# Patient Record
Sex: Male | Born: 1980 | Race: Black or African American | Hispanic: No | Marital: Married | Smoking: Never smoker
Health system: Southern US, Community
[De-identification: ages and names within clinical notes are randomized; demographics above are authoritative.]

## PROBLEM LIST (undated history)

## (undated) DIAGNOSIS — E559 Vitamin D deficiency, unspecified: Secondary | ICD-10-CM

## (undated) DIAGNOSIS — F32A Depression, unspecified: Secondary | ICD-10-CM

## (undated) DIAGNOSIS — K649 Unspecified hemorrhoids: Secondary | ICD-10-CM

## (undated) DIAGNOSIS — E669 Obesity, unspecified: Secondary | ICD-10-CM

## (undated) DIAGNOSIS — R7303 Prediabetes: Secondary | ICD-10-CM

## (undated) DIAGNOSIS — E785 Hyperlipidemia, unspecified: Secondary | ICD-10-CM

## (undated) DIAGNOSIS — I10 Essential (primary) hypertension: Secondary | ICD-10-CM

## (undated) DIAGNOSIS — F329 Major depressive disorder, single episode, unspecified: Secondary | ICD-10-CM

## (undated) HISTORY — DX: Obesity, unspecified: E66.9

## (undated) HISTORY — DX: Depression, unspecified: F32.A

## (undated) HISTORY — DX: Vitamin D deficiency, unspecified: E55.9

## (undated) HISTORY — DX: Prediabetes: R73.03

## (undated) HISTORY — DX: Essential (primary) hypertension: I10

## (undated) HISTORY — DX: Major depressive disorder, single episode, unspecified: F32.9

## (undated) HISTORY — PX: WISDOM TOOTH EXTRACTION: SHX21

## (undated) HISTORY — DX: Hyperlipidemia, unspecified: E78.5

## (undated) HISTORY — DX: Unspecified hemorrhoids: K64.9

---

## 1999-04-19 ENCOUNTER — Encounter: Payer: Self-pay | Admitting: Emergency Medicine

## 1999-04-19 ENCOUNTER — Emergency Department (HOSPITAL_COMMUNITY): Admission: EM | Admit: 1999-04-19 | Discharge: 1999-04-19 | Payer: Self-pay | Admitting: Emergency Medicine

## 2001-10-06 ENCOUNTER — Emergency Department (HOSPITAL_COMMUNITY): Admission: EM | Admit: 2001-10-06 | Discharge: 2001-10-06 | Payer: Self-pay | Admitting: Emergency Medicine

## 2010-05-28 ENCOUNTER — Inpatient Hospital Stay (INDEPENDENT_AMBULATORY_CARE_PROVIDER_SITE_OTHER)
Admission: RE | Admit: 2010-05-28 | Discharge: 2010-05-28 | Disposition: A | Discharge status: Home / Self Care | Payer: 59 | Source: Ambulatory Visit | Attending: Family Medicine | Admitting: Family Medicine

## 2010-05-28 DIAGNOSIS — J029 Acute pharyngitis, unspecified: Secondary | ICD-10-CM

## 2010-05-28 DIAGNOSIS — R599 Enlarged lymph nodes, unspecified: Secondary | ICD-10-CM

## 2011-05-11 ENCOUNTER — Other Ambulatory Visit (HOSPITAL_COMMUNITY): Payer: Self-pay | Admitting: Internal Medicine

## 2011-05-11 ENCOUNTER — Ambulatory Visit (HOSPITAL_COMMUNITY)
Admission: RE | Admit: 2011-05-11 | Discharge: 2011-05-11 | Disposition: A | Discharge status: Home / Self Care | Payer: 59 | Source: Ambulatory Visit | Attending: Internal Medicine | Admitting: Internal Medicine

## 2011-05-11 DIAGNOSIS — M545 Low back pain, unspecified: Secondary | ICD-10-CM | POA: Insufficient documentation

## 2011-05-11 DIAGNOSIS — IMO0002 Reserved for concepts with insufficient information to code with codable children: Secondary | ICD-10-CM | POA: Insufficient documentation

## 2011-05-16 ENCOUNTER — Encounter: Payer: Self-pay | Admitting: Gastroenterology

## 2011-05-20 ENCOUNTER — Ambulatory Visit: Payer: 59 | Admitting: Gastroenterology

## 2011-06-01 ENCOUNTER — Ambulatory Visit: Payer: 59 | Admitting: Gastroenterology

## 2011-06-02 ENCOUNTER — Telehealth: Payer: Self-pay | Admitting: Gastroenterology

## 2011-06-02 NOTE — Telephone Encounter (Signed)
Message copied by Arna Snipe on Thu Jun 02, 2011  8:17 AM ------      Message from: Donata Duff      Created: Wed Jun 01, 2011  9:23 AM       Do not bill

## 2011-07-13 ENCOUNTER — Emergency Department (INDEPENDENT_AMBULATORY_CARE_PROVIDER_SITE_OTHER)
Admission: EM | Admit: 2011-07-13 | Discharge: 2011-07-13 | Disposition: A | Discharge status: Home / Self Care | Payer: 59 | Source: Home / Self Care

## 2011-07-13 ENCOUNTER — Encounter (HOSPITAL_COMMUNITY): Payer: Self-pay | Admitting: *Deleted

## 2011-07-13 DIAGNOSIS — S61409A Unspecified open wound of unspecified hand, initial encounter: Secondary | ICD-10-CM

## 2011-07-13 DIAGNOSIS — S61412A Laceration without foreign body of left hand, initial encounter: Secondary | ICD-10-CM

## 2011-07-13 MED ORDER — BACITRACIN 500 UNIT/GM EX OINT
1.0000 "application " | TOPICAL_OINTMENT | Freq: Once | CUTANEOUS | Status: AC
  Administered 2011-07-13: 1 via TOPICAL

## 2011-07-13 NOTE — ED Provider Notes (Signed)
History     CSN: [id number]  Arrival date & time 07/13/11  1946   None     Chief Complaint  Patient presents with  . Extremity Laceration    (Consider location/radiation/quality/duration/timing/severity/associated sxs/prior treatment) HPI Comments: Patient presents this evening with a left hand laceration. He states that this occurred just prior to arrival. He has been removing some old flooring from the home and as he was removing one of the garbage bags from the house sustained a laceration to the palm of his left hand. He is assuming there is a nail in the garbage bag. His last tetanus vaccine was February of this year.   Past Medical History  Diagnosis Date  . Hypertension   . Depression   . Hyperlipidemia   . Pre-diabetes     History reviewed. No pertinent past surgical history.  Family History  Problem Relation Age of Onset  . Hypertension Father   . Heart disease Mother   . Diabetes Father   . Depression Mother   . Thyroid disease Mother   . Hyperlipidemia Brother     History  Substance Use Topics  . Smoking status: Never Smoker   . Smokeless tobacco: Not on file  . Alcohol Use: No      Review of Systems  Musculoskeletal: Negative for joint swelling.  Skin: Positive for wound.  Neurological: Negative for weakness and numbness.    Allergies  Statins  Home Medications   Current Outpatient Rx  Name Route Sig Dispense Refill  . VITAMIN D 2000 UNITS PO CAPS Oral Take by mouth as directed.    [name] CITALOPRAM HYDROBROMIDE 20 MG PO TABS Oral Take 20 mg by mouth daily.    . COLESEVELAM HCL 3.75 G PO PACK Oral Take by mouth as directed.    [name] EZETIMIBE 10 MG PO TABS Oral Take 10 mg by mouth daily.    [name] LISINOPRIL-HYDROCHLOROTHIAZIDE 20-12.5 MG PO TABS Oral Take 1 tablet by mouth daily.      BP 158/85  Pulse 94  Temp(Src) 97.1 F (36.2 C) (Oral)  Resp 18  SpO2 98%  Physical Exam  Nursing note and vitals reviewed. Constitutional: He appears  well-developed and well-nourished. No distress.  HENT:  Head: Normocephalic and atraumatic.  Musculoskeletal:       Left hand: He exhibits laceration. He exhibits normal range of motion, no bony tenderness, normal capillary refill, no deformity and no swelling. normal sensation noted. Normal strength noted.       Hands: Neurological: He is alert.  Skin: Skin is warm and dry.  Psychiatric: He has a normal mood and affect.    ED Course  LACERATION REPAIR Date/Time: 07/13/2011 9:12 PM Performed by: Melody Comas Authorized by: Melody Comas Consent: Verbal consent obtained. Written consent not obtained. Consent given by: patient Patient understanding: patient states understanding of the procedure being performed Patient consent: the patient's understanding of the procedure matches consent given Body area: upper extremity Location details: left hand Laceration length: 1.5 cm Tendon involvement: none Nerve involvement: none Vascular damage: no Preparation: Patient was prepped and draped in the usual sterile fashion. Amount of cleaning: extensive Debridement: none Degree of undermining: none Skin closure: 5-0 nylon Number of sutures: 3 Technique: simple Approximation: close Approximation difficulty: simple Dressing: antibiotic ointment and 4x4 sterile gauze Patient tolerance: Patient tolerated the procedure well with no immediate complications. Comments: Pt requested no local anesthetic for suturing.    (including critical care time)  Labs Reviewed - No  data to display No results found.   1. Laceration of left hand       MDM          Melody Comas, [place] 07/13/11 2120

## 2011-07-13 NOTE — ED Notes (Signed)
Pt with laceration left palm of hand approx 1cm long - bleeding controlled with dressing - pt had tetnus x 2 months ago

## 2011-07-13 NOTE — Discharge Instructions (Signed)
Tylenol or Ibuprofen as needed for discomfort. Wash the wound twice daily with mild soap and water. Then apply an antibiotic ointment and a clean dressing. Watch for signs of infection, such as redness, swelling or drainage. If this develops return as soon as possible for recheck. Suture removal in 7 days. Laceration Care, Adult A laceration is a cut that goes through all layers of the skin. The cut goes into the tissue beneath the skin. HOME CARE For stitches (sutures) or staples:  Keep the cut clean and dry.   If you have a bandage (dressing), change it at least once a day. Change the bandage if it gets wet or dirty, or as told by your doctor.   Wash the cut with soap and water 2 times a day. Rinse the cut with water. Pat it dry with a clean towel.   Put a thin layer of medicated cream on the cut as told by your doctor.   You may shower after the first 24 hours. Do not soak the cut in water until the stitches are removed.   Only take medicines as told by your doctor.   Have your stitches or staples removed as told by your doctor.  For skin adhesive strips:  Keep the cut clean and dry.   Do not get the strips wet. You may take a bath, but be careful to keep the cut dry.   If the cut gets wet, pat it dry with a clean towel.   The strips will fall off on their own. Do not remove the strips that are still stuck to the cut.  For wound glue:  You may shower or take baths. Do not soak or scrub the cut. Do not swim. Avoid heavy sweating until the glue falls off on its own. After a shower or bath, pat the cut dry with a clean towel.   Do not put medicine on your cut until the glue falls off.   If you have a bandage, do not put tape over the glue.   Avoid lots of sunlight or tanning lamps until the glue falls off. Put sunscreen on the cut for the first year to reduce your scar.   The glue will fall off on its own. Do not pick at the glue.  You may need a tetanus shot if:  You  cannot remember when you had your last tetanus shot.   You have never had a tetanus shot.  If you need a tetanus shot and you choose not to have one, you may get tetanus. Sickness from tetanus can be serious. GET HELP RIGHT AWAY IF:   Your pain does not get better with medicine.   Your arm, hand, leg, or foot loses feeling (numbness) or changes color.   Your cut is bleeding.   Your joint feels weak, or you cannot use your joint.   You have painful lumps on your body.   Your cut is red, puffy (swollen), or painful.   You have a red line on the skin near the cut.   You have yellowish-white fluid (pus) coming from the cut.   You have a fever.   You have a bad smell coming from the cut or bandage.   Your cut breaks open before or after stitches are removed.   You notice something coming out of the cut, such as wood or glass.   You cannot move a finger or toe.  MAKE SURE YOU:   Understand these instructions.     Will watch your condition.   Will get help right away if you are not doing well or get worse.  Document Released: 09/07/2007 Document Revised: 03/10/2011 Document Reviewed: 09/14/2010 [employer] Patient Information 2012 [employer]. 

## 2011-07-13 NOTE — ED Notes (Signed)
HAND SOAKING BETADINE AND NORMAL SALINE

## 2011-07-14 NOTE — ED Provider Notes (Signed)
Medical screening examination/treatment/procedure(s) were performed by non-physician practitioner and as supervising physician I was immediately available for consultation/collaboration.   [facility]; MD   Sharin Grave, MD 07/14/11 [phone]

## 2011-07-20 ENCOUNTER — Emergency Department (HOSPITAL_COMMUNITY)
Admission: EM | Admit: 2011-07-20 | Discharge: 2011-07-20 | Disposition: A | Discharge status: Home / Self Care | Payer: 59 | Source: Home / Self Care | Attending: Emergency Medicine | Admitting: Emergency Medicine

## 2011-07-20 ENCOUNTER — Encounter (HOSPITAL_COMMUNITY): Payer: Self-pay | Admitting: Emergency Medicine

## 2011-07-20 DIAGNOSIS — S61409A Unspecified open wound of unspecified hand, initial encounter: Secondary | ICD-10-CM

## 2011-07-20 DIAGNOSIS — S61419A Laceration without foreign body of unspecified hand, initial encounter: Secondary | ICD-10-CM

## 2011-07-20 NOTE — Discharge Instructions (Signed)

## 2011-07-20 NOTE — ED Provider Notes (Signed)
History     CSN: [id number]  Arrival date & time 07/20/11  4098   First MD Initiated Contact with Patient 07/20/11 [phone]      Chief Complaint  Patient presents with  . Suture / Staple Removal    (Consider location/radiation/quality/duration/timing/severity/associated sxs/prior treatment) HPI Comments: Here today to have stiches removed, doing better. No swelling and no redness, I need them out.  Patient is a 31 y.o. male presenting with suture removal.  Suture / Staple Removal  The sutures were placed 7 to 10 days ago. Treatments since wound repair include antibiotic ointment use. There has been no drainage from the wound. There is no redness present. There is no swelling present. The pain has no pain. He has no difficulty moving the affected extremity or digit.    Past Medical History  Diagnosis Date  . Hypertension   . Depression   . Hyperlipidemia   . Pre-diabetes     History reviewed. No pertinent past surgical history.  Family History  Problem Relation Age of Onset  . Hypertension Father   . Heart disease Mother   . Diabetes Father   . Depression Mother   . Thyroid disease Mother   . Hyperlipidemia Brother     History  Substance Use Topics  . Smoking status: Never Smoker   . Smokeless tobacco: Not on file  . Alcohol Use: No      Review of Systems  Constitutional: Negative for chills and diaphoresis.  Neurological: Negative for weakness and numbness.    Allergies  Statins  Home Medications   Current Outpatient Rx  Name Route Sig Dispense Refill  . VITAMIN D 2000 UNITS PO CAPS Oral Take by mouth as directed.    [name] CITALOPRAM HYDROBROMIDE 20 MG PO TABS Oral Take 20 mg by mouth daily.    . COLESEVELAM HCL 3.75 G PO PACK Oral Take by mouth as directed.    [name] EZETIMIBE 10 MG PO TABS Oral Take 10 mg by mouth daily.    [name] LISINOPRIL-HYDROCHLOROTHIAZIDE 20-12.5 MG PO TABS Oral Take 1 tablet by mouth daily.      BP 107/80  Pulse 87  Temp(Src) 97.9 F  (36.6 C) (Oral)  Resp 14  SpO2 99%  Physical Exam  Nursing note and vitals reviewed. Constitutional: He appears well-developed and well-nourished.  Eyes: Conjunctivae are normal.  Musculoskeletal: He exhibits no tenderness.       Left hand: He exhibits laceration. He exhibits normal range of motion, normal two-point discrimination, normal capillary refill, no deformity and no swelling. normal sensation noted. Normal strength noted.       Hands: Skin: No rash noted. No erythema.    ED Course  Procedures (including critical care time)  Labs Reviewed - No data to display No results found.   1. Laceration of hand       MDM  Uncomplicated suture removal- Left hand palmar surface- no infection. Full ROM, no neuromuscular deficits        Jimmie Molly, MD 07/20/11 [phone]

## 2011-07-20 NOTE — ED Notes (Signed)
Pt here to have sutures removed

## 2011-08-15 ENCOUNTER — Encounter: Payer: Self-pay | Admitting: Gastroenterology

## 2011-09-05 ENCOUNTER — Encounter: Payer: Self-pay | Admitting: Gastroenterology

## 2011-09-05 ENCOUNTER — Ambulatory Visit (INDEPENDENT_AMBULATORY_CARE_PROVIDER_SITE_OTHER): Payer: 59 | Admitting: Gastroenterology

## 2011-09-05 VITALS — BP 134/72 | HR 92 | Ht 76.0 in | Wt 359.0 lb

## 2011-09-05 DIAGNOSIS — K602 Anal fissure, unspecified: Secondary | ICD-10-CM | POA: Insufficient documentation

## 2011-09-05 DIAGNOSIS — K625 Hemorrhage of anus and rectum: Secondary | ICD-10-CM

## 2011-09-05 MED ORDER — MOVIPREP 100 G PO SOLR: 1.0000 | ORAL | Status: DC

## 2011-09-05 NOTE — Progress Notes (Signed)
HPI: This is a   very pleasant 31 year old man whom I am meeting for the first time today.  Had anal fissure last year, after a large painful BM.  Went to MD, given topical cream was given.  Sounds like a steroid cream.  Intermittently he has itching, pain.  Wipes a lot, can see a bit of blood.  Usually has to push and strain a lot to move bowels.  Overall stable weight.      Review of systems: Pertinent positive and negative review of systems were noted in the above HPI section. Complete review of systems was performed and was otherwise normal.    Past Medical History  Diagnosis Date  . Hypertension   . Depression   . Hyperlipidemia   . Pre-diabetes   . Hemorrhoids     No past surgical history on file.  Current Outpatient Prescriptions  Medication Sig Dispense Refill  . Cholecalciferol (VITAMIN D) 2000 UNITS CAPS Take by mouth as directed.      . citalopram (CELEXA) 20 MG tablet Take 20 mg by mouth daily.      . Colesevelam HCl 3.75 G PACK Take by mouth as directed.      . ezetimibe (ZETIA) 10 MG tablet Take 10 mg by mouth daily.      [name] lisinopril-hydrochlorothiazide (PRINZIDE,ZESTORETIC) 20-12.5 MG per tablet Take 1 tablet by mouth daily.        Allergies as of 09/05/2011 - Review Complete 09/05/2011  Allergen Reaction Noted  . Statins  05/25/2011    Family History  Problem Relation Age of Onset  . Hypertension Father   . Heart disease Mother   . Diabetes Father   . Depression Mother   . Thyroid disease Mother   . Hyperlipidemia Brother   . Colon cancer Neg Hx     History   Social History  . Marital Status: Married    Spouse Name: N/A    Number of Children: 2  . Years of Education: N/A   Occupational History  . Not on file.   Social History Main Topics  . Smoking status: Never Smoker   . Smokeless tobacco: Never Used  . Alcohol Use: No  . Drug Use: No  . Sexually Active: Not on file   Other Topics Concern  . Not on file   Social History Narrative    Daily caffeine        Physical Exam: BP 134/72  Pulse 92  Ht 6\' 4"  (1.93 m)  Wt 359 lb (162.841 kg)  BMI 43.70 kg/m2 Constitutional: generally well-appearing Psychiatric: alert and oriented x3 Eyes: extraocular movements intact Mouth: oral pharynx moist, no lesions Neck: supple no lymphadenopathy Cardiovascular: heart regular rate and rhythm Lungs: clear to auscultation bilaterally Abdomen: soft, nontender, nondistended, no obvious ascites, no peritoneal signs, normal bowel sounds Extremities: no lower extremity edema bilaterally Skin: no lesions on visible extremities Rectal examination: Shallow, fairly clear, midline posterior anal fissure that was tender to touch. No distal rectal tumors, brown stool.   Assessment and plan: 31 y.o. male with  anal fissure, minor rectal bleeding  I suspect her seizure is responsible for his minor rectal bleeding however we should proceed with colonoscopy to rule out neoplastic causes. In the meantime he will try sitz baths twice daily. He will bulk up with fiber supplements and will use baby wipes after each bowel movement.

## 2011-09-05 NOTE — Patient Instructions (Signed)
Sitz bath in warm water 20 min a day. Please start taking citrucel (orange flavored) powder fiber supplement.  This may cause some bloating at first but that usually goes away. Begin with a small spoonful and work your way up to a large, heaping spoonful daily over a week. Use baby wipe after BM. You will be set up for a colonoscopy at [facility].

## 2011-09-17 ENCOUNTER — Ambulatory Visit (INDEPENDENT_AMBULATORY_CARE_PROVIDER_SITE_OTHER): Payer: 59 | Admitting: Emergency Medicine

## 2011-09-17 ENCOUNTER — Ambulatory Visit: Payer: 59

## 2011-09-17 VITALS — BP 140/87 | HR 102 | Temp 97.9°F | Resp 20 | Ht 75.0 in | Wt 352.0 lb

## 2011-09-17 DIAGNOSIS — R300 Dysuria: Secondary | ICD-10-CM

## 2011-09-17 DIAGNOSIS — R109 Unspecified abdominal pain: Secondary | ICD-10-CM

## 2011-09-17 LAB — POCT URINALYSIS DIPSTICK
Blood, UA: NEGATIVE
Nitrite, UA: NEGATIVE
Protein, UA: NEGATIVE
Spec Grav, UA: >=1.03
Urobilinogen, UA: 0.2

## 2011-09-17 LAB — POCT UA - MICROSCOPIC ONLY
Casts, Ur, LPF, POC: NEGATIVE
Crystals, Ur, HPF, POC: NEGATIVE
Yeast, UA: NEGATIVE

## 2011-09-17 MED ORDER — DOXYCYCLINE HYCLATE 100 MG PO TABS: 100.0000 mg | ORAL_TABLET | Freq: Two times a day (BID) | ORAL | Status: AC

## 2011-09-17 MED ORDER — TAMSULOSIN HCL 0.4 MG PO CAPS: 0.4000 mg | ORAL_CAPSULE | Freq: Every day | ORAL | Status: DC

## 2011-09-17 NOTE — Progress Notes (Signed)
  Subjective:    Patient ID: Frank Shaffer, male    DOB: [birth year], 31 y.o.   MRN: [id number]  HPI   patient was in his usual state of health until Thursday morning when he had the onset of severe lower abdominal pain associated with an urgency to pee. He continues to feel an urgency to urinate but feels he does not empty his bladder completely. He has had no new sexual exposures. He denies a discharge    Review of Systems     Objective:   Physical Exam physical exam abdomen is obese. There are no areas of tenderness except in the suprapubic area. He denies any new sexual exposures.  Results for orders placed in visit on 09/17/11  POCT UA - MICROSCOPIC ONLY      Component Value Range   WBC, Ur, HPF, POC 4-6     RBC, urine, microscopic 0-1     Bacteria, U Microscopic trace     Mucus, UA neg     Epithelial cells, urine per micros 1-3     Crystals, Ur, HPF, POC neg     Casts, Ur, LPF, POC neg     Yeast, UA neg    POCT URINALYSIS DIPSTICK      Component Value Range   Color, UA yellow     Clarity, UA clear     Glucose, UA neg     Bilirubin, UA neg     Ketones, UA neg     Spec Grav, UA >=1.030     Blood, UA neg     pH, UA 5.5     Protein, UA neg     Urobilinogen, UA 0.2     Nitrite, UA neg     Leukocytes, UA Negative     [facility] reading (PRIMARY) by  Dr. there is calcific densities deep in the left side of the pelvis possibly at the left UVJ.       Assessment & Plan:  Patient most likely has a renal stone. We'll go ahead and check a urine, urine culture, and do a KUB. We'll treat with doxycycline 100 twice a day given a strainer if symptoms worsen we'll need to do a CT urogram.

## 2011-09-18 LAB — URINE CULTURE: Colony Count: 2000

## 2011-09-23 ENCOUNTER — Telehealth: Payer: Self-pay | Admitting: Gastroenterology

## 2011-09-26 NOTE — Telephone Encounter (Addendum)
Left message on machine to call back colon moved to 10/27/11 830 am

## 2011-09-27 NOTE — Telephone Encounter (Signed)
Pt aware and has been re instructed.

## 2011-10-10 ENCOUNTER — Telehealth: Payer: Self-pay | Admitting: Gastroenterology

## 2011-10-10 MED ORDER — HYDROCORTISONE ACE-PRAMOXINE 1-1 % RE CREA: TOPICAL_CREAM | Freq: Two times a day (BID) | RECTAL | Status: AC

## 2011-10-10 NOTE — Telephone Encounter (Signed)
Pt is scheduled for colon on 10/27/11.  He has been using fiber to bulk up his stool but feels it has worsened his anal fissure.  He does sitz baths several time daily but it only helps slightly.  Can we send in something to help the rectal pain.  He says he has a nitroglycerin cream but does not like the way it makes him feel  Please advise

## 2011-10-10 NOTE — Telephone Encounter (Signed)
Pt is aware that the prescription has been sent

## 2011-10-10 NOTE — Telephone Encounter (Signed)
analpram ointment, send one large tube, refill 3, apply to anus twice daily as needed  Thanks

## 2011-10-13 ENCOUNTER — Telehealth: Payer: Self-pay | Admitting: Gastroenterology

## 2011-10-13 MED ORDER — NITROGLYCERIN 0.4 % RE OINT: 1.0000 "application " | TOPICAL_OINTMENT | Freq: Two times a day (BID) | RECTAL | Status: DC

## 2011-10-13 NOTE — Telephone Encounter (Signed)
Pt aware, medication sent to the pharmacy

## 2011-10-13 NOTE — Telephone Encounter (Signed)
Pt is having sharp rectal pains "spasms"  Worse after having a bowel movement.  He has been using wipes and sitz baths.  He says after the sitz baths he has rectal burning.  He has no rectal bleeding.  Has a hard time functioning daily.  He is scheduled for colon at [facility] on 10/27/11.  Please advise

## 2011-10-13 NOTE — Telephone Encounter (Signed)
Lets try rectiv 0.4% ointment.  Disp 1 large tube, apply twice daily to anus.  Refill 2 times He should also take one otc alleve twice daily to help with pain as well.

## 2011-10-27 ENCOUNTER — Encounter (HOSPITAL_COMMUNITY): Payer: Self-pay

## 2011-10-27 ENCOUNTER — Encounter (HOSPITAL_COMMUNITY)
Admission: RE | Disposition: A | Discharge status: Home / Self Care | Payer: Self-pay | Source: Ambulatory Visit | Attending: Gastroenterology

## 2011-10-27 ENCOUNTER — Ambulatory Visit (HOSPITAL_COMMUNITY)
Admission: RE | Admit: 2011-10-27 | Discharge: 2011-10-27 | Disposition: A | Discharge status: Home / Self Care | Payer: 59 | Source: Ambulatory Visit | Attending: Gastroenterology | Admitting: Gastroenterology

## 2011-10-27 DIAGNOSIS — K602 Anal fissure, unspecified: Secondary | ICD-10-CM

## 2011-10-27 DIAGNOSIS — K625 Hemorrhage of anus and rectum: Secondary | ICD-10-CM

## 2011-10-27 DIAGNOSIS — E785 Hyperlipidemia, unspecified: Secondary | ICD-10-CM | POA: Insufficient documentation

## 2011-10-27 DIAGNOSIS — I10 Essential (primary) hypertension: Secondary | ICD-10-CM | POA: Insufficient documentation

## 2011-10-27 HISTORY — PX: COLONOSCOPY: SHX5424

## 2011-10-27 SURGERY — COLONOSCOPY
Anesthesia: Moderate Sedation

## 2011-10-27 MED ORDER — FENTANYL CITRATE 0.05 MG/ML IJ SOLN
INTRAMUSCULAR | Status: DC | PRN
  Administered 2011-10-27 (×4): 25 ug via INTRAVENOUS

## 2011-10-27 MED ORDER — MIDAZOLAM HCL 5 MG/5ML IJ SOLN
INTRAMUSCULAR | Status: DC | PRN
  Administered 2011-10-27: 2 mg via INTRAVENOUS
  Administered 2011-10-27 (×2): 1 mg via INTRAVENOUS
  Administered 2011-10-27: 2 mg via INTRAVENOUS

## 2011-10-27 MED ORDER — SODIUM CHLORIDE 0.9 % IV SOLN
INTRAVENOUS | Status: DC
  Administered 2011-10-27: 500 mL via INTRAVENOUS

## 2011-10-27 MED ORDER — MIDAZOLAM HCL 10 MG/2ML IJ SOLN
INTRAMUSCULAR | Status: AC
  Filled 2011-10-27: qty 4

## 2011-10-27 MED ORDER — FENTANYL CITRATE 0.05 MG/ML IJ SOLN
INTRAMUSCULAR | Status: AC
  Filled 2011-10-27: qty 4

## 2011-10-27 MED ORDER — DIPHENHYDRAMINE HCL 50 MG/ML IJ SOLN
INTRAMUSCULAR | Status: AC
  Filled 2011-10-27: qty 1

## 2011-10-27 NOTE — H&P (Signed)
  HPI: This is a man with rectal bleeding, known anal fissure    Past Medical History  Diagnosis Date  . Hypertension   . Hyperlipidemia   . Pre-diabetes   . Hemorrhoids   . Depression     History reviewed. No pertinent past surgical history.  No current facility-administered medications for this encounter.    Allergies as of 09/05/2011 - Review Complete 09/05/2011  Allergen Reaction Noted  . Statins  05/25/2011    Family History  Problem Relation Age of Onset  . Hypertension Father   . Heart disease Mother   . Diabetes Father   . Depression Mother   . Thyroid disease Mother   . Hyperlipidemia Brother   . Colon cancer Neg Hx     History   Social History  . Marital Status: Married    Spouse Name: N/A    Number of Children: 2  . Years of Education: N/A   Occupational History  . Not on file.   Social History Main Topics  . Smoking status: Never Smoker   . Smokeless tobacco: Never Used  . Alcohol Use: No  . Drug Use: No  . Sexually Active: Yes   Other Topics Concern  . Not on file   Social History Narrative   Daily caffeine       Physical Exam: BP 146/104  Temp 98.6 F (37 C) (Oral)  Resp 18  Ht 6\' 3"  (1.905 m)  Wt 351 lb (159.213 kg)  BMI 43.87 kg/m2  SpO2 98% Constitutional: generally well-appearing Psychiatric: alert and oriented x3 Abdomen: soft, nontender, nondistended, no obvious ascites, no peritoneal signs, normal bowel sounds     Assessment and plan: 31 y.o. male with rectal bleeding, likely from fissure  For colonoscopy today

## 2011-10-27 NOTE — Op Note (Signed)
[facility] [address] [place], [place]  [address]  COLONOSCOPY PROCEDURE REPORT  PATIENT:  Frank Shaffer, Frank Shaffer  MR#:  [id number] BIRTHDATE:  [birth year], 30 yrs. old  GENDER:  male ENDOSCOPIST:  Rachael Fee, MD REF. BY:  Lucky Cowboy, M.D. PROCEDURE DATE:  10/27/2011 PROCEDURE:  Colonoscopy 21308 ASA CLASS:  Class II INDICATIONS:  anal fissure, intermittent rectal bleeding MEDICATIONS:   Fentanyl 100 mcg IV, Versed 6 mg IV  DESCRIPTION OF PROCEDURE:   After the risks benefits and alternatives of the procedure were thoroughly explained, informed consent was obtained.  Digital rectal exam was performed and revealed no rectal masses.   The [id number] endoscope was introduced through the anus and advanced to the cecum, which was identified by both the appendix and ileocecal valve, without limitations.  The quality of the prep was good..  The instrument was then slowly withdrawn as the colon was fully examined. <<PROCEDUREIMAGES>> FINDINGS:  A normal appearing cecum, ileocecal valve, and appendiceal orifice were identified. The ascending, hepatic flexure, transverse, splenic flexure, descending, sigmoid colon, and rectum appeared unremarkable (see image1, image2, and image3). Retroflexed views in the rectum revealed no abnormalities. COMPLICATIONS:  None  ENDOSCOPIC IMPRESSION: 1) Normal colon 2) No polyps or cancers 3) Previously noted anal fissure is nearly completely resolved now  RECOMMENDATIONS: Resume daily fiber supplements and periodic Rectiv ointment to anal as needed.  ______________________________ Rachael Fee, MD  n. eSIGNED:   Rachael Fee at 10/27/2011 09:00 AM  Barnett Applebaum, [phone]

## 2011-10-28 ENCOUNTER — Encounter (HOSPITAL_COMMUNITY): Payer: Self-pay

## 2011-10-28 ENCOUNTER — Encounter (HOSPITAL_COMMUNITY): Payer: Self-pay | Admitting: Gastroenterology

## 2012-08-19 ENCOUNTER — Emergency Department (HOSPITAL_COMMUNITY)
Admission: EM | Admit: 2012-08-19 | Discharge: 2012-08-19 | Disposition: A | Discharge status: Home / Self Care | Payer: 59 | Source: Home / Self Care

## 2012-08-19 ENCOUNTER — Encounter (HOSPITAL_COMMUNITY): Payer: Self-pay | Admitting: Emergency Medicine

## 2012-08-19 DIAGNOSIS — IMO0001 Reserved for inherently not codable concepts without codable children: Secondary | ICD-10-CM

## 2012-08-19 DIAGNOSIS — L03019 Cellulitis of unspecified finger: Secondary | ICD-10-CM

## 2012-08-19 MED ORDER — CEPHALEXIN 500 MG PO CAPS: 500.0000 mg | ORAL_CAPSULE | Freq: Four times a day (QID) | ORAL | Status: DC

## 2012-08-19 NOTE — ED Provider Notes (Signed)
History     CSN: [id number]  Arrival date & time 08/19/12  1146   First MD Initiated Contact with Patient 08/19/12 1231      Chief Complaint  Patient presents with  . Hand Problem    left middle finger infection x 6 days.     (Consider location/radiation/quality/duration/timing/severity/associated sxs/prior treatment) HPI Comments: 32-year-old male who developed soreness and swelling around the nail of the left long finger. He first noticed discomfort 3-4 days ago. He works as a Curator and he states he may have injured in some way but did not realize it and it got infected. 2 days ago there was much swelling along the medial and proximal aspect of the soft tissue of the nail. It spontaneously ruptured and there was much pus that drained. He states he is beginning to swell up again and is requesting treatment. Denies systemic symptoms such as fever or chills. Denies hand pain.    Past Medical History  Diagnosis Date  . Hypertension   . Hyperlipidemia   . Pre-diabetes   . Hemorrhoids   . Depression     Past Surgical History  Procedure Laterality Date  . Colonoscopy  10/27/2011    Procedure: COLONOSCOPY;  Surgeon: Rachael Fee, MD;  Location: [facility];  Service: Endoscopy;  Laterality: N/A;  pt is above weight limit    Family History  Problem Relation Age of Onset  . Hypertension Father   . Heart disease Mother   . Diabetes Father   . Depression Mother   . Thyroid disease Mother   . Hyperlipidemia Brother   . Colon cancer Neg Hx     History  Substance Use Topics  . Smoking status: Never Smoker   . Smokeless tobacco: Never Used  . Alcohol Use: No      Review of Systems  Constitutional: Negative.   Gastrointestinal: Negative.   Musculoskeletal: Negative.   Skin:       As per history of present illness  Neurological: Negative.     Allergies  Statins  Home Medications   Current Outpatient Rx  Name  Route  Sig  Dispense  Refill  . citalopram  (CELEXA) 20 MG tablet   Oral   Take 20 mg by mouth daily.         . Colesevelam HCl 3.75 G PACK   Oral   Take by mouth as directed.         . ezetimibe (ZETIA) 10 MG tablet   Oral   Take 10 mg by mouth daily.         [name] lisinopril-hydrochlorothiazide (PRINZIDE,ZESTORETIC) 20-12.5 MG per tablet   Oral   Take 1 tablet by mouth daily.         . cephALEXin (KEFLEX) 500 MG capsule   Oral   Take 1 capsule (500 mg total) by mouth 4 (four) times daily.   24 capsule   0   . Cholecalciferol (VITAMIN D) 2000 UNITS CAPS   Oral   Take by mouth as directed.         [name] FIBER PO   Oral   Take by mouth.         [name] MOVIPREP 100 G SOLR   Oral   Take 1 kit (100 g total) by mouth as directed. Name brand only   1 kit   0     Dispense as written.   . Nitroglycerin (RECTIV) 0.4 % OINT   Rectal   Place 1 application  rectally 2 (two) times daily at 10 AM and 5 PM.   1 Tube   2   . Tamsulosin HCl (FLOMAX) 0.4 MG CAPS   Oral   Take 1 capsule (0.4 mg total) by mouth daily.   30 capsule   3     BP 132/78  Pulse 101  Temp(Src) 98.3 F (36.8 C) (Oral)  Resp 17  SpO2 100%  Physical Exam  Nursing note and vitals reviewed. Constitutional: He is oriented to person, place, and time. He appears well-developed and well-nourished. No distress.  Neck: Neck supple.  Pulmonary/Chest: Effort normal.  Musculoskeletal: Normal range of motion.  Neurological: He is alert and oriented to person, place, and time. He exhibits normal muscle tone.  Skin: Skin is warm and dry.  Minor swelling of the soft tissues adjacent to the nailbed at the base and medial aspect. No active drainage or bleeding. Minimal erythema. Symptoms started skin into the phalanx or IP joint. Flexion and extension of the IP joints are normal.  Psychiatric: He has a normal mood and affect.    ED Course  INCISION AND DRAINAGE Date/Time: 08/19/2012 1:01 PM Performed by: Phineas Real, Ruthe Roemer Authorized by: Bradd Canary  D Consent: Verbal consent obtained. Risks and benefits: risks, benefits and alternatives were discussed Consent given by: patient Patient understanding: patient states understanding of the procedure being performed Patient identity confirmed: verbally with patient Type: abscess Body area: upper extremity Location details: left long finger Anesthesia: local infiltration Local anesthetic: lidocaine 1% without epinephrine Anesthetic total: 0.5 ml Patient sedated: no Scalpel size: 11 Incision type: single straight Complexity: simple Drainage: bloody Drainage amount: scant Wound treatment: wound left open Packing material: none   (including critical care time)  Labs Reviewed - No data to display No results found.   1. Paronychia of third finger, left       MDM  Release of paronychia with blood content only, no pus.  Keflex 500 qID for 6 days. Warm salty water for 2 days If worse, new problems may return.        Hayden Rasmussen, NP 08/19/12 1302

## 2012-08-19 NOTE — ED Provider Notes (Signed)
Medical screening examination/treatment/procedure(s) were performed by resident physician or non-physician practitioner and as supervising physician I was immediately available for consultation/collaboration.   KINDL,JAMES DOUGLAS MD.   James D Kindl, MD 08/19/12 1717 

## 2012-08-19 NOTE — ED Notes (Signed)
Pt c/o infection of the left middle finger. Pt states "drained pus that had an odor". Symptoms present x 6 days. Pt denies any other symptoms.

## 2012-08-19 NOTE — ED Notes (Signed)
Pt's finger was dressed with sterile 2x2 and kerlix. Mw,cma

## 2012-11-25 IMAGING — CR DG ABDOMEN 1V
2 series · 2 of 2 positions shown · non-contrast
Comparison: None.

CLINICAL DATA: Nominal pain.  Right-sided kidney stone.

ABDOMEN - 1 VIEW

[AP (1 of 2)]
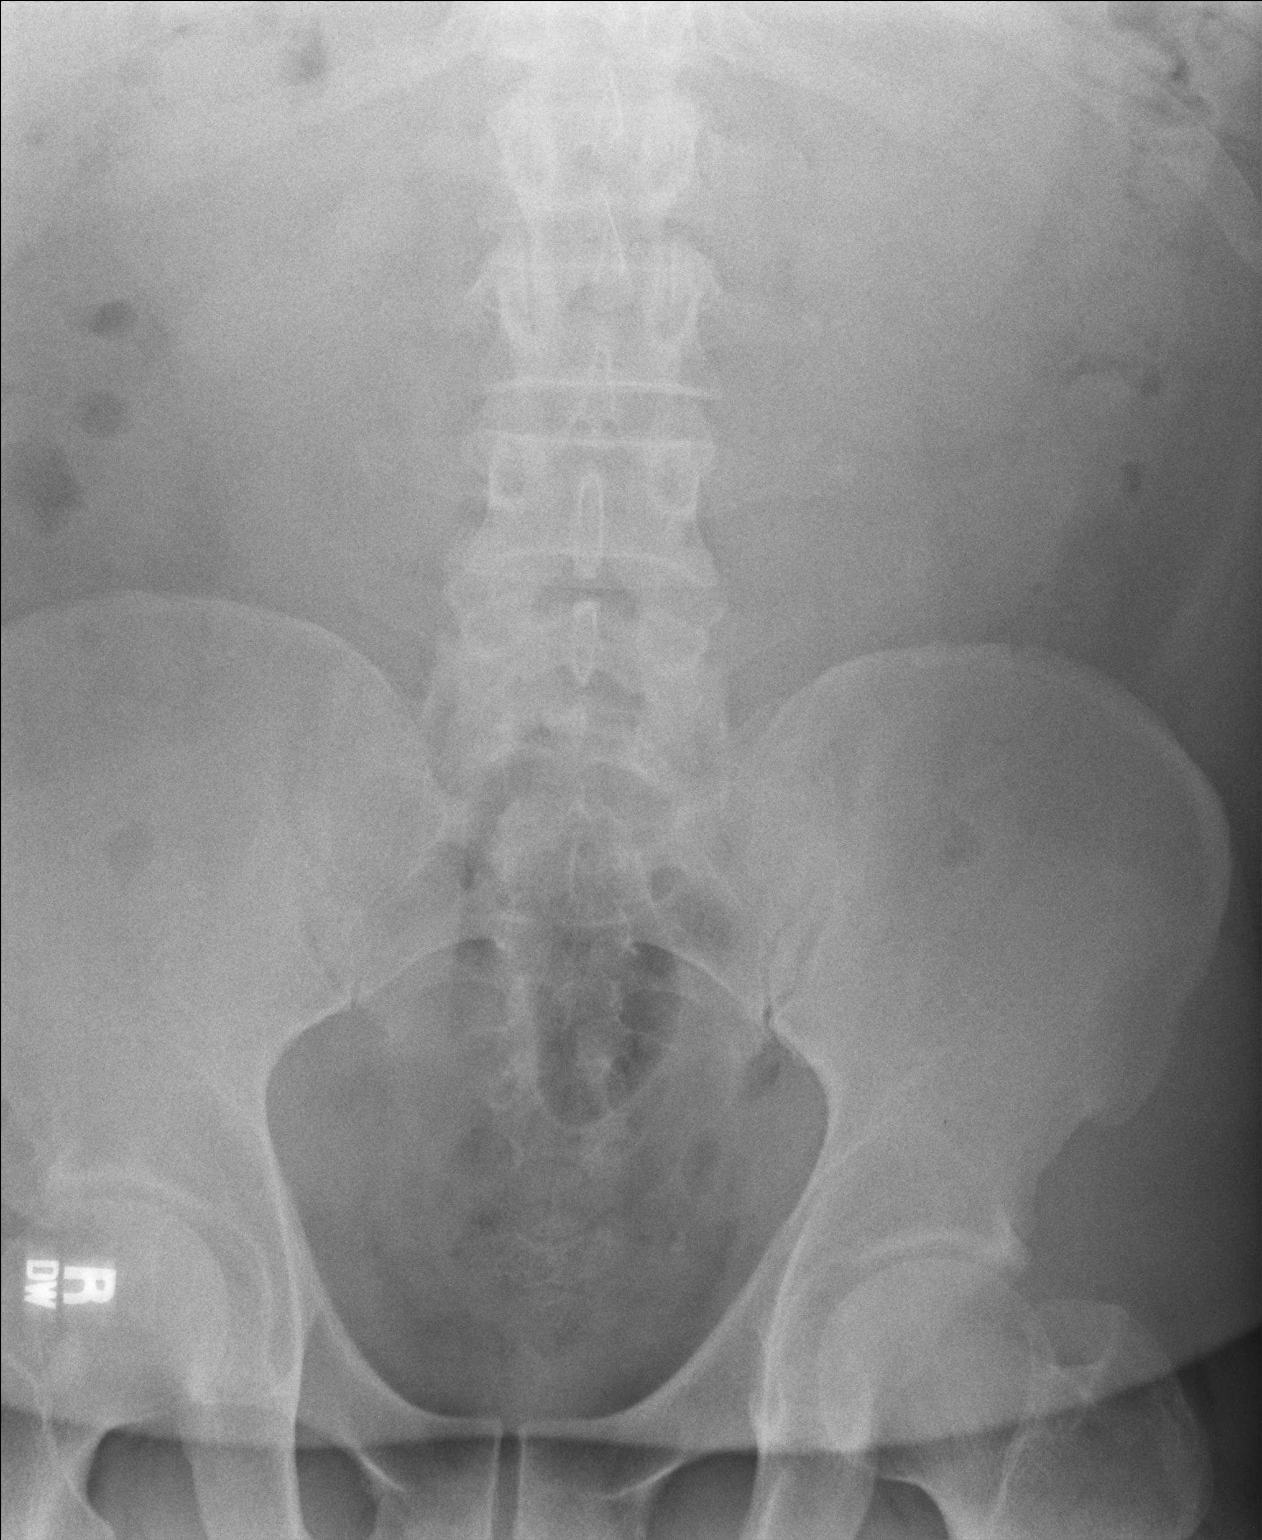

[AP (2 of 2)]
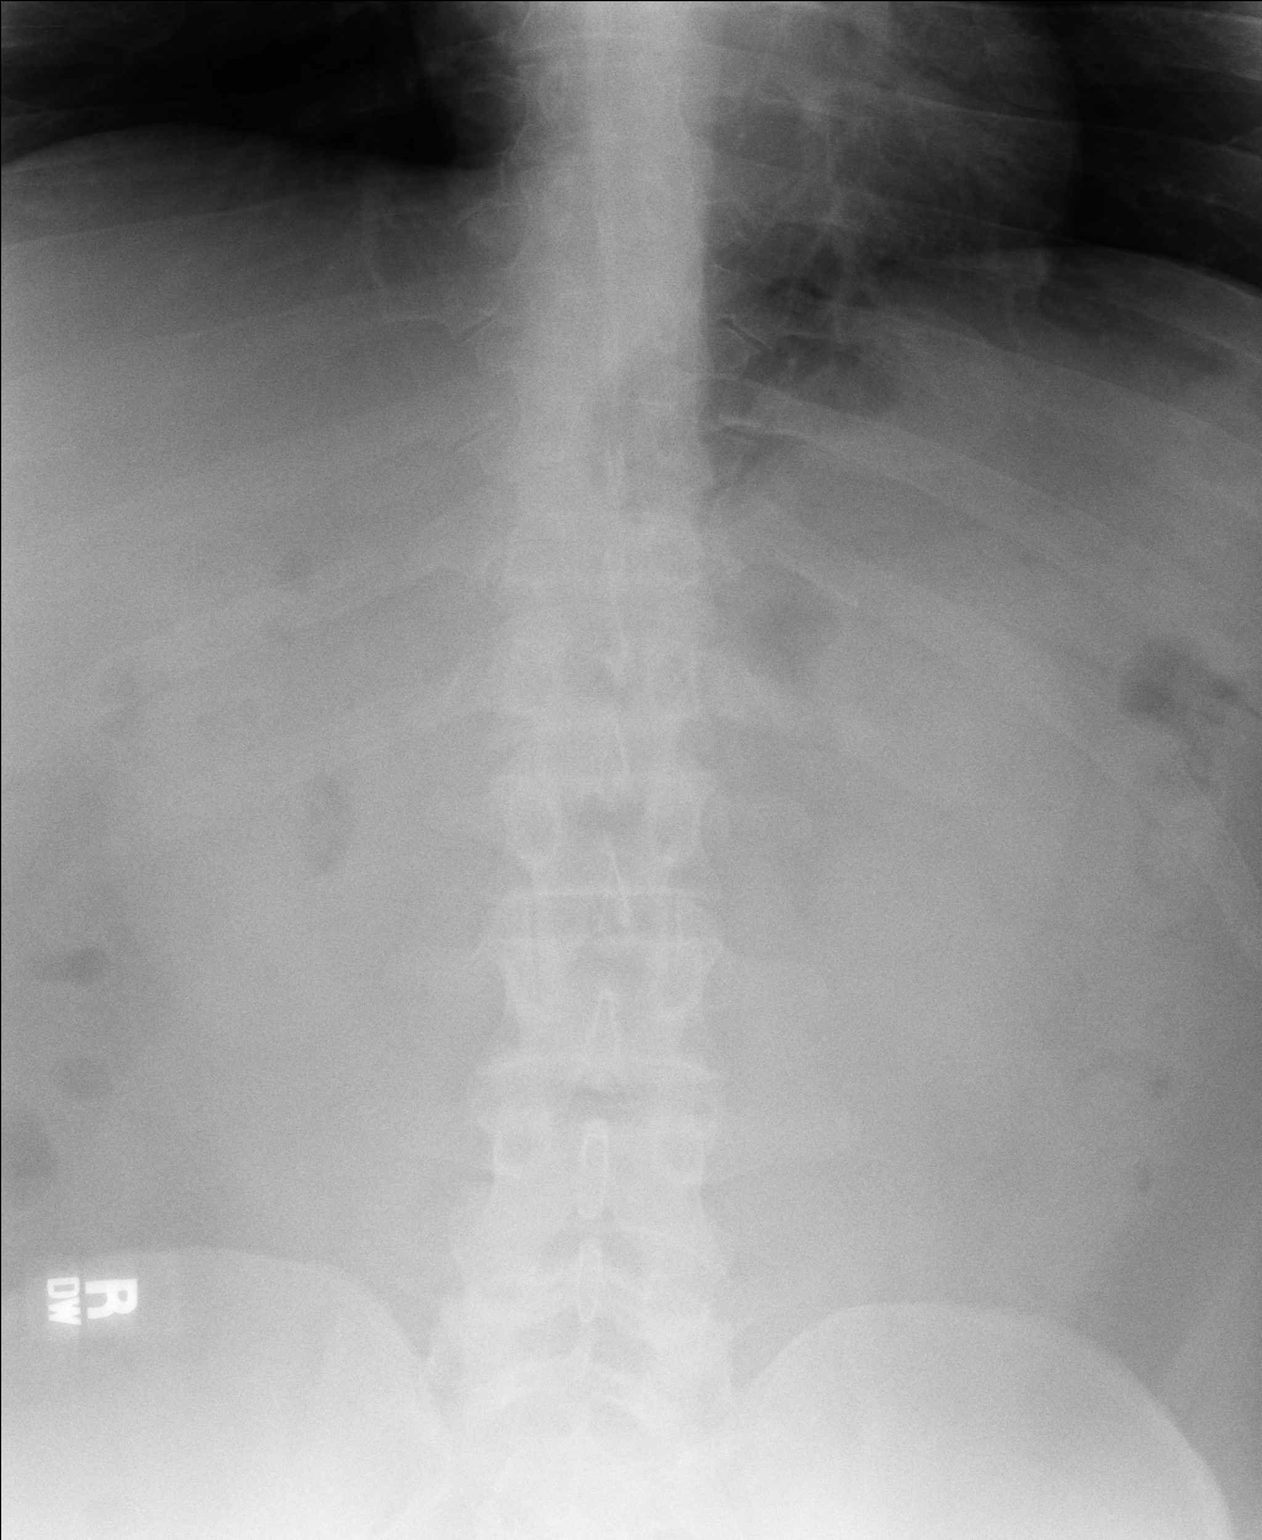

[2 of 2 positions shown; findings below may reference images not displayed]

FINDINGS: Tiny calcification is present left anatomic pelvis which
could represent a small distal left ureteral calculus or
phlebolith.  No calculi are identified over the renal shadows.
Suboptimal penetration on the radiographs.
IMPRESSION: Possible distal left ureteral calculus with calcification in the
left anatomic pelvis near the left UVJ.

Clinically significant discrepancy from primary report, if
provided: None

## 2013-02-24 ENCOUNTER — Encounter (HOSPITAL_COMMUNITY): Payer: Self-pay | Admitting: Emergency Medicine

## 2013-02-24 ENCOUNTER — Emergency Department (HOSPITAL_COMMUNITY)
Admission: EM | Admit: 2013-02-24 | Discharge: 2013-02-24 | Disposition: A | Discharge status: Home / Self Care | Payer: 59 | Attending: Emergency Medicine | Admitting: Emergency Medicine

## 2013-02-24 DIAGNOSIS — F411 Generalized anxiety disorder: Secondary | ICD-10-CM | POA: Insufficient documentation

## 2013-02-24 DIAGNOSIS — F329 Major depressive disorder, single episode, unspecified: Secondary | ICD-10-CM | POA: Insufficient documentation

## 2013-02-24 DIAGNOSIS — Z79899 Other long term (current) drug therapy: Secondary | ICD-10-CM | POA: Insufficient documentation

## 2013-02-24 DIAGNOSIS — F419 Anxiety disorder, unspecified: Secondary | ICD-10-CM

## 2013-02-24 DIAGNOSIS — Z792 Long term (current) use of antibiotics: Secondary | ICD-10-CM | POA: Insufficient documentation

## 2013-02-24 DIAGNOSIS — I10 Essential (primary) hypertension: Secondary | ICD-10-CM | POA: Insufficient documentation

## 2013-02-24 DIAGNOSIS — Z8639 Personal history of other endocrine, nutritional and metabolic disease: Secondary | ICD-10-CM | POA: Insufficient documentation

## 2013-02-24 DIAGNOSIS — F3289 Other specified depressive episodes: Secondary | ICD-10-CM | POA: Insufficient documentation

## 2013-02-24 DIAGNOSIS — F911 Conduct disorder, childhood-onset type: Secondary | ICD-10-CM | POA: Insufficient documentation

## 2013-02-24 DIAGNOSIS — Z862 Personal history of diseases of the blood and blood-forming organs and certain disorders involving the immune mechanism: Secondary | ICD-10-CM | POA: Insufficient documentation

## 2013-02-24 DIAGNOSIS — Z8719 Personal history of other diseases of the digestive system: Secondary | ICD-10-CM | POA: Insufficient documentation

## 2013-02-24 LAB — ACETAMINOPHEN LEVEL: Acetaminophen (Tylenol), Serum: <15 ug/mL (ref 10–30)

## 2013-02-24 LAB — ETHANOL: Alcohol, Ethyl (B): <11 mg/dL (ref 0–11)

## 2013-02-24 LAB — COMPREHENSIVE METABOLIC PANEL
Alkaline Phosphatase: 111 U/L (ref 39–117)
BUN: 19 mg/dL (ref 6–23)
Calcium: 9.2 mg/dL (ref 8.4–10.5)
Creatinine, Ser: 1.26 mg/dL (ref 0.50–1.35)
GFR calc Af Amer: 85 mL/min — ABNORMAL LOW (ref >90)
Glucose, Bld: 122 mg/dL — ABNORMAL HIGH (ref 70–99)
Total Protein: 7.3 g/dL (ref 6.0–8.3)

## 2013-02-24 LAB — CBC WITH DIFFERENTIAL/PLATELET
Eosinophils Absolute: 0.4 10*3/uL (ref 0.0–0.7)
Eosinophils Relative: 5 % (ref 0–5)
Hemoglobin: 16.8 g/dL (ref 13.0–17.0)
Lymphs Abs: 2.2 10*3/uL (ref 0.7–4.0)
MCH: 31.2 pg (ref 26.0–34.0)
MCV: 88.1 fL (ref 78.0–100.0)
Monocytes Relative: 9 % (ref 3–12)
RBC: 5.38 MIL/uL (ref 4.22–5.81)

## 2013-02-24 LAB — SALICYLATE LEVEL: Salicylate Lvl: <2 mg/dL — ABNORMAL LOW (ref 2.8–20.0)

## 2013-02-24 NOTE — BH Assessment (Signed)
Tele Assessment Note   Frank Shaffer is an 32 y.o. male. Patient presents to ED with his father due to irritability.  Patient states he has "no tolerance for nonsense" and that is how he was raised. Patient says he gets irritable with people if they say something to him that he finds bothersome and then he will verbally respond in an angry tone. Patient states he calms down quickly, usually episodes last 10-15 minutes and then he feels remorseful and apologizes to the person he spoke to in a negative manner.  He worries about the effect on his wife and children when he responds in an angry manner.  Patient states he has never been physically aggressive with anyone.  He describes his level of irritability as unchanged for the past several years. Tonight he became angry when putting up the Christmas tree and the lights would not work.  He states he feels anxious often and experiences stomach cramping with his anxiety.   Patient denies suicidal, homicidal ideation, psychosis, and drug and alcohol use. He describes his mood as "fine".  He has a family history of depression, although patient denies any depressive symptoms.  His medication is prescribed by his primary care physician. Patient has been on his current medication for the past 3 years and he feels it is not working. Patient wants to feel even tempered, and not easily bothered by little things.   Patient does not feel he needs hospitalization and feels safe to return home.  He denies being at risk of injuring anyone, including himself, stating he has never done that. Patient feels he needs to have his medication adjusted and will return to his primary care doctor. Offered patient list of psychiatrists if he wants to have medication adjusted by a psychiatrist. Referral list faxed to patient's nurse.   Reviewed case with Alberteen Sam, NP, who recommends discharge with follow up with primary care doctor.   Axis I: Anxiety Disorder NOS Axis II:  Deferred Axis III:  Past Medical History  Diagnosis Date  . Hypertension   . Hyperlipidemia   . Pre-diabetes   . Hemorrhoids   . Depression    Axis IV: problems with primary support group Axis V: 61-70 mild symptoms  Past Medical History:  Past Medical History  Diagnosis Date  . Hypertension   . Hyperlipidemia   . Pre-diabetes   . Hemorrhoids   . Depression     Past Surgical History  Procedure Laterality Date  . Colonoscopy  10/27/2011    Procedure: COLONOSCOPY;  Surgeon: Rachael Fee, MD;  Location: [facility];  Service: Endoscopy;  Laterality: N/A;  pt is above weight limit    Family History:  Family History  Problem Relation Age of Onset  . Hypertension Father   . Heart disease Mother   . Diabetes Father   . Depression Mother   . Thyroid disease Mother   . Hyperlipidemia Brother   . Colon cancer Neg Hx     Social History:  reports that he has never smoked. He has never used smokeless tobacco. He reports that he does not drink alcohol or use illicit drugs.  Additional Social History:  Alcohol / Drug Use Pain Medications:  (denies) Prescriptions:  (takes meds as prescribed) Over the Counter:  (denies) History of alcohol / drug use?: No history of alcohol / drug abuse  CIWA: CIWA-Ar BP: 171/83 mmHg Pulse Rate: 95 COWS:    Allergies:  Allergies  Allergen Reactions  . Statins Other (  See Comments)    Myalgias and weakness  . Latex Itching and Rash    Home Medications:  (Not in a hospital admission)  OB/GYN Status:  No LMP for male patient.  General Assessment Data Location of Assessment: [facility] ED Is this a Tele or Face-to-Face Assessment?: Tele Assessment Is this an Initial Assessment or a Re-assessment for this encounter?: Initial Assessment Living Arrangements: Spouse/significant other;Children Can pt return to current living arrangement?: Yes Admission Status: Voluntary Is patient capable of signing voluntary admission?: Yes Transfer from:  Home Referral Source: Self/Family/Friend     [facility] Crisis Care Plan Living Arrangements: Spouse/significant other;Children  Education Status Is patient currently in school?: No  Risk to self Suicidal Ideation: No Suicidal Intent: No Is patient at risk for suicide?: No Suicidal Plan?: No Access to Means: No What has been your use of drugs/alcohol within the last 12 months?: none Previous Attempts/Gestures: No How many times?: 0 Other Self Harm Risks: none Intentional Self Injurious Behavior: None Family Suicide History: No Recent stressful life event(s): Other (Comment) (none) Persecutory voices/beliefs?: No Depression: No Substance abuse history and/or treatment for substance abuse?: No Suicide prevention information given to non-admitted patients: Not applicable  Risk to Others Homicidal Ideation: No Thoughts of Harm to Others: No Current Homicidal Intent: No Current Homicidal Plan: No Access to Homicidal Means: No History of harm to others?: No Assessment of Violence: None Noted Does patient have access to weapons?: No Criminal Charges Pending?: No Does patient have a court date: No  Psychosis Hallucinations: None noted Delusions: None noted  Mental Status Report Appear/Hygiene: Other (Comment) (unremarkable) Eye Contact: Good Motor Activity: Unremarkable Speech: Logical/coherent Level of Consciousness: Alert Mood:  ("Fine") Affect: Appropriate to circumstance Anxiety Level: Moderate Thought Processes: Coherent;Relevant Judgement: Unimpaired Orientation: Person;Place;Time;Situation Obsessive Compulsive Thoughts/Behaviors: None  Cognitive Functioning Concentration: Normal Memory: Recent Intact;Remote Intact IQ: Average Insight: Good Impulse Control: Good Appetite: Good Weight Loss: 0 Weight Gain: 0 Sleep: No Change Vegetative Symptoms: None  ADLScreening [facility]) Patient's cognitive ability adequate to safely complete daily  activities?: Yes Patient able to express need for assistance with ADLs?: Yes Independently performs ADLs?: Yes (appropriate for developmental age)  Prior Inpatient Therapy Prior Inpatient Therapy: No  Prior Outpatient Therapy Prior Outpatient Therapy: No  ADL Screening (condition at time of admission) Patient's cognitive ability adequate to safely complete daily activities?: Yes Is the patient deaf or have difficulty hearing?: No Does the patient have difficulty seeing, even when wearing glasses/contacts?: No Does the patient have difficulty concentrating, remembering, or making decisions?: No Patient able to express need for assistance with ADLs?: Yes Does the patient have difficulty dressing or bathing?: No Independently performs ADLs?: Yes (appropriate for developmental age) Does the patient have difficulty walking or climbing stairs?: No Weakness of Legs: None Weakness of Arms/Hands: None       Abuse/Neglect Assessment (Assessment to be complete while patient is alone) Physical Abuse: Denies Verbal Abuse: Denies Sexual Abuse: Denies Exploitation of patient/patient's resources: Denies Self-Neglect: Denies     Merchant navy officer (For Healthcare) Advance Directive: Patient does not have advance directive;Patient would not like information Nutrition Screen- [facility] Patient's home diet: Regular  Additional Information 1:1 In Past 12 Months?: No CIRT Risk: No Elopement Risk: No Does patient have medical clearance?: Yes     Disposition:  Disposition Initial Assessment Completed for this Encounter: Yes Disposition of Patient: Outpatient treatment Type of outpatient treatment: Adult  Yates Decamp 02/24/2013 11:02 PM

## 2013-02-24 NOTE — Progress Notes (Signed)
Spoke with Ivar Drape, PA regarding recommendation to discharge pt and follow up with his primary care doctor. PA agreed with disposition recommendation.  Reviewed recommendations with patient's nurse as well.    Rosey Bath, RN

## 2013-02-24 NOTE — ED Notes (Addendum)
Not sure if he is here for anxiety or not. Feeling over aggressive, frustrated. H/o similar. Denies HI/SI. Takes medications for anxiety/mood (propanolol, lisinopril, citalopram). Here with father. (denies: HA, pain, sob, dizziness nv or other sx), "feels better at this time", denies physical sx at this time, "but when he is anxious, he has some stomach pains". Denies ETOH, smoking or drug use.

## 2013-02-24 NOTE — ED Notes (Signed)
Pt discharged.Vital signs stable and GCS 15.Discharge instruction given. 

## 2013-02-24 NOTE — Progress Notes (Signed)
Spoke with Ivar Drape, PA regarding patient.  Patient presents with anxiety and depression and anger issues with family.  Denies suicidal and homicidal ideation. Will assess patient at 31. Rosey Bath, RN

## 2013-02-24 NOTE — ED Provider Notes (Signed)
CSN: [id number]     Arrival date & time 02/24/13  2038 History   First MD Initiated Contact with Patient 02/24/13 2112     Chief Complaint  Patient presents with  . Anxiety   (Consider location/radiation/quality/duration/timing/severity/associated sxs/prior Treatment) HPI Comments:  Patient presents to the emergency department with chief complaint of depression and anxiety. Patient states that he has become very aggressive and angry. He states that he has episodes where he is unable to control his anger. He denies any SI or HI. He states that he is tried taking Prozac for his symptoms with no relief. He states that he makes a big deal out of very small things. He is concerned because is causing problems with his relationships. He denies any drug or alcohol abuse.  The history is provided by the patient. No language interpreter was used.    Past Medical History  Diagnosis Date  . Hypertension   . Hyperlipidemia   . Pre-diabetes   . Hemorrhoids   . Depression    Past Surgical History  Procedure Laterality Date  . Colonoscopy  10/27/2011    Procedure: COLONOSCOPY;  Surgeon: Rachael Fee, MD;  Location: [facility];  Service: Endoscopy;  Laterality: N/A;  pt is above weight limit   Family History  Problem Relation Age of Onset  . Hypertension Father   . Heart disease Mother   . Diabetes Father   . Depression Mother   . Thyroid disease Mother   . Hyperlipidemia Brother   . Colon cancer Neg Hx    History  Substance Use Topics  . Smoking status: Never Smoker   . Smokeless tobacco: Never Used  . Alcohol Use: No    Review of Systems  All other systems reviewed and are negative.    Allergies  Statins  Home Medications   Current Outpatient Rx  Name  Route  Sig  Dispense  Refill  . cephALEXin (KEFLEX) 500 MG capsule   Oral   Take 1 capsule (500 mg total) by mouth 4 (four) times daily.   24 capsule   0   . Cholecalciferol (VITAMIN D) 2000 UNITS CAPS   Oral   Take  by mouth as directed.         . citalopram (CELEXA) 20 MG tablet   Oral   Take 20 mg by mouth daily.         . Colesevelam HCl 3.75 G PACK   Oral   Take by mouth as directed.         . ezetimibe (ZETIA) 10 MG tablet   Oral   Take 10 mg by mouth daily.         [name] FIBER PO   Oral   Take by mouth.         [name] lisinopril-hydrochlorothiazide (PRINZIDE,ZESTORETIC) 20-12.5 MG per tablet   Oral   Take 1 tablet by mouth daily.         [name] MOVIPREP 100 G SOLR   Oral   Take 1 kit (100 g total) by mouth as directed. Name brand only   1 kit   0     Dispense as written.   . Nitroglycerin (RECTIV) 0.4 % OINT   Rectal   Place 1 application rectally 2 (two) times daily at 10 AM and 5 PM.   1 Tube   2   . Tamsulosin HCl (FLOMAX) 0.4 MG CAPS   Oral   Take 1 capsule (0.4 mg total) by mouth daily.  30 capsule   3    BP 171/83  Pulse 95  Temp(Src) 98.1 F (36.7 C) (Oral)  Resp 18  Wt 352 lb (159.666 kg)  SpO2 99% Physical Exam  Nursing note and vitals reviewed. Constitutional: He is oriented to person, place, and time. He appears well-developed and well-nourished.  HENT:  Head: Normocephalic and atraumatic.  Right Ear: External ear normal.  Left Ear: External ear normal.  Nose: Nose normal.  Mouth/Throat: Oropharynx is clear and moist. No oropharyngeal exudate.  Eyes: Conjunctivae and EOM are normal. Pupils are equal, round, and reactive to light. Right eye exhibits no discharge. Left eye exhibits no discharge. No scleral icterus.  Neck: Normal range of motion. Neck supple. No JVD present.  Cardiovascular: Normal rate, regular rhythm, normal heart sounds and intact distal pulses.  Exam reveals no gallop and no friction rub.   No murmur heard. Pulmonary/Chest: Effort normal and breath sounds normal. No respiratory distress. He has no wheezes. He has no rales. He exhibits no tenderness.  Abdominal: Soft. Bowel sounds are normal. He exhibits no distension and no mass.  There is no tenderness. There is no rebound and no guarding.  Musculoskeletal: Normal range of motion. He exhibits no edema and no tenderness.  Neurological: He is alert and oriented to person, place, and time. He has normal reflexes.  CN 3-12 intact  Skin: Skin is warm and dry.  Psychiatric: He has a normal mood and affect. His behavior is normal. Judgment and thought content normal.    ED Course  Procedures (including critical care time) Results for orders placed during the hospital encounter of 02/24/13  CBC WITH DIFFERENTIAL      Result Value Range   WBC 8.2  4.0 - 10.5 K/uL   RBC 5.38  4.22 - 5.81 MIL/uL   Hemoglobin 16.8  13.0 - 17.0 g/dL   HCT 16.1  09.6 - 04.5 %   MCV 88.1  78.0 - 100.0 fL   MCH 31.2  26.0 - 34.0 pg   MCHC 35.4  30.0 - 36.0 g/dL   RDW 40.9  81.1 - 91.4 %   Platelets 163  150 - 400 K/uL   Neutrophils Relative % 59  43 - 77 %   Neutro Abs 4.9  1.7 - 7.7 K/uL   Lymphocytes Relative 27  12 - 46 %   Lymphs Abs 2.2  0.7 - 4.0 K/uL   Monocytes Relative 9  3 - 12 %   Monocytes Absolute 0.7  0.1 - 1.0 K/uL   Eosinophils Relative 5  0 - 5 %   Eosinophils Absolute 0.4  0.0 - 0.7 K/uL   Basophils Relative 0  0 - 1 %   Basophils Absolute 0.0  0.0 - 0.1 K/uL  COMPREHENSIVE METABOLIC PANEL      Result Value Range   Sodium 137  135 - 145 mEq/L   Potassium 4.0  3.5 - 5.1 mEq/L   Chloride 103  96 - 112 mEq/L   CO2 22  19 - 32 mEq/L   Glucose, Bld 122 (*) 70 - 99 mg/dL   BUN 19  6 - 23 mg/dL   Creatinine, Ser 7.82  0.50 - 1.35 mg/dL   Calcium 9.2  8.4 - 95.6 mg/dL   Total Protein 7.3  6.0 - 8.3 g/dL   Albumin 3.5  3.5 - 5.2 g/dL   AST 28  0 - 37 U/L   ALT 31  0 - 53 U/L   Alkaline  Phosphatase 111  39 - 117 U/L   Total Bilirubin 0.2 (*) 0.3 - 1.2 mg/dL   GFR calc non Af Amer 74 (*) >90 mL/min   GFR calc Af Amer 85 (*) >90 mL/min  ETHANOL      Result Value Range   Alcohol, Ethyl (B) <11  0 - 11 mg/dL  SALICYLATE LEVEL      Result Value Range   Salicylate Lvl  <2.0 (*) 2.8 - 20.0 mg/dL  ACETAMINOPHEN LEVEL      Result Value Range   Acetaminophen (Tylenol), Serum <15.0  10 - 30 ug/mL      EKG Interpretation   None       MDM   1. Anxiety      Psych hold orders were placed, TTS consult for depression, anxiety, and anger issues. Patient denies SI or HI, but given the severity of the anger issues I could foresee was becoming a problem.  11:14 PM Patient seen by and discussed with TTS and psych nurse practitioner, who feel that the patient is suitable for outpatient follow-up.  Will give resources.  No SI/HI.  Patient states that he feels safe for discharge.     Roxy Horseman, PA-C 02/24/13 2316

## 2013-02-24 NOTE — ED Notes (Signed)
Pt denies SI or HI .  

## 2013-02-27 NOTE — ED Provider Notes (Signed)
Medical screening examination/treatment/procedure(s) were performed by non-physician practitioner and as supervising physician I was immediately available for consultation/collaboration.  EKG Interpretation   None         Marina Desire J Arianne Klinge, MD 02/27/13 0746 

## 2013-03-18 ENCOUNTER — Other Ambulatory Visit: Payer: Self-pay | Admitting: Emergency Medicine

## 2013-03-18 MED ORDER — PROPRANOLOL HCL 20 MG PO TABS: 20.0000 mg | ORAL_TABLET | Freq: Every day | ORAL | Status: DC

## 2013-04-15 ENCOUNTER — Other Ambulatory Visit: Payer: Self-pay | Admitting: Emergency Medicine

## 2013-04-15 MED ORDER — COLESEVELAM HCL 3.75 G PO PACK: 1.0000 | PACK | Freq: Every day | ORAL | Status: DC

## 2013-04-17 ENCOUNTER — Other Ambulatory Visit: Payer: Self-pay | Admitting: Physician Assistant

## 2013-04-17 ENCOUNTER — Other Ambulatory Visit: Payer: Self-pay | Admitting: Emergency Medicine

## 2013-04-17 MED ORDER — LISINOPRIL 40 MG PO TABS: 40.0000 mg | ORAL_TABLET | Freq: Every day | ORAL | Status: DC

## 2013-04-17 NOTE — Progress Notes (Signed)
Called pt, the soonest he could sch was 05-22-13

## 2013-05-19 ENCOUNTER — Encounter: Payer: Self-pay | Admitting: *Deleted

## 2013-05-22 ENCOUNTER — Ambulatory Visit: Payer: Self-pay | Admitting: Emergency Medicine

## 2013-05-29 ENCOUNTER — Encounter: Payer: Self-pay | Admitting: Emergency Medicine

## 2013-05-29 ENCOUNTER — Ambulatory Visit (INDEPENDENT_AMBULATORY_CARE_PROVIDER_SITE_OTHER): Payer: 59 | Admitting: Emergency Medicine

## 2013-05-29 VITALS — BP 142/94 | HR 94 | Temp 98.0°F | Resp 18 | Ht 76.0 in | Wt 352.0 lb

## 2013-05-29 DIAGNOSIS — M25562 Pain in left knee: Secondary | ICD-10-CM

## 2013-05-29 DIAGNOSIS — F419 Anxiety disorder, unspecified: Secondary | ICD-10-CM | POA: Insufficient documentation

## 2013-05-29 DIAGNOSIS — I10 Essential (primary) hypertension: Secondary | ICD-10-CM | POA: Insufficient documentation

## 2013-05-29 DIAGNOSIS — J029 Acute pharyngitis, unspecified: Secondary | ICD-10-CM

## 2013-05-29 DIAGNOSIS — E785 Hyperlipidemia, unspecified: Secondary | ICD-10-CM | POA: Insufficient documentation

## 2013-05-29 DIAGNOSIS — R7303 Prediabetes: Secondary | ICD-10-CM | POA: Insufficient documentation

## 2013-05-29 DIAGNOSIS — R6889 Other general symptoms and signs: Secondary | ICD-10-CM

## 2013-05-29 DIAGNOSIS — E559 Vitamin D deficiency, unspecified: Secondary | ICD-10-CM | POA: Insufficient documentation

## 2013-05-29 DIAGNOSIS — F329 Major depressive disorder, single episode, unspecified: Secondary | ICD-10-CM

## 2013-05-29 DIAGNOSIS — F32A Depression, unspecified: Secondary | ICD-10-CM

## 2013-05-29 DIAGNOSIS — E669 Obesity, unspecified: Secondary | ICD-10-CM

## 2013-05-29 DIAGNOSIS — F3289 Other specified depressive episodes: Secondary | ICD-10-CM

## 2013-05-29 DIAGNOSIS — R7309 Other abnormal glucose: Secondary | ICD-10-CM

## 2013-05-29 LAB — CBC WITH DIFFERENTIAL/PLATELET
Basophils Absolute: 0 10*3/uL (ref 0.0–0.1)
Basophils Relative: 0 % (ref 0–1)
EOS PCT: 4 % (ref 0–5)
Eosinophils Absolute: 0.3 10*3/uL (ref 0.0–0.7)
HEMATOCRIT: 48.5 % (ref 39.0–52.0)
Hemoglobin: 17 g/dL (ref 13.0–17.0)
LYMPHS ABS: 1.7 10*3/uL (ref 0.7–4.0)
LYMPHS PCT: 24 % (ref 12–46)
MCH: 30 pg (ref 26.0–34.0)
MCHC: 35.1 g/dL (ref 30.0–36.0)
MCV: 85.7 fL (ref 78.0–100.0)
MONO ABS: 0.7 10*3/uL (ref 0.1–1.0)
Monocytes Relative: 10 % (ref 3–12)
Neutro Abs: 4.3 10*3/uL (ref 1.7–7.7)
Neutrophils Relative %: 62 % (ref 43–77)
PLATELETS: 180 10*3/uL (ref 150–400)
RBC: 5.66 MIL/uL (ref 4.22–5.81)
RDW: 13.2 % (ref 11.5–15.5)
WBC: 6.9 10*3/uL (ref 4.0–10.5)

## 2013-05-29 LAB — HEMOGLOBIN A1C
Hgb A1c MFr Bld: 5.5 % (ref <5.7)
Mean Plasma Glucose: 111 mg/dL (ref <117)

## 2013-05-29 MED ORDER — AZITHROMYCIN 250 MG PO TABS
ORAL_TABLET | ORAL | Status: AC
Start: 2013-05-29 — End: 2013-06-03

## 2013-05-29 MED ORDER — COLESEVELAM HCL 3.75 G PO PACK: 1.0000 | PACK | Freq: Every day | ORAL | Status: DC

## 2013-05-29 NOTE — Patient Instructions (Signed)
Generalized Anxiety Disorder Generalized anxiety disorder (GAD) is a mental disorder. It interferes with life functions, including relationships, work, and school. GAD is different from normal anxiety, which everyone experiences at some point in their lives in response to specific life events and activities. Normal anxiety actually helps us prepare for and get through these life events and activities. Normal anxiety goes away after the event or activity is over.  GAD causes anxiety that is not necessarily related to specific events or activities. It also causes excess anxiety in proportion to specific events or activities. The anxiety associated with GAD is also difficult to control. GAD can vary from mild to severe. People with severe GAD can have intense waves of anxiety with physical symptoms (panic attacks).  SYMPTOMS The anxiety and worry associated with GAD are difficult to control. This anxiety and worry are related to many life events and activities and also occur more days than not for 6 months or longer. People with GAD also have three or more of the following symptoms (one or more in children):  Restlessness.   Fatigue.  Difficulty concentrating.   Irritability.  Muscle tension.  Difficulty sleeping or unsatisfying sleep. DIAGNOSIS GAD is diagnosed through an assessment by your caregiver. Your caregiver will ask you questions aboutyour mood,physical symptoms, and events in your life. Your caregiver may ask you about your medical history and use of alcohol or drugs, including prescription medications. Your caregiver may also do a physical exam and blood tests. Certain medical conditions and the use of certain substances can cause symptoms similar to those associated with GAD. Your caregiver may refer you to a mental health specialist for further evaluation. TREATMENT The following therapies are usually used to treat GAD:   Medication Antidepressant medication usually is  prescribed for long-term daily control. Antianxiety medications may be added in severe cases, especially when panic attacks occur.   Talk therapy (psychotherapy) Certain types of talk therapy can be helpful in treating GAD by providing support, education, and guidance. A form of talk therapy called cognitive behavioral therapy can teach you healthy ways to think about and react to daily life events and activities.  Stress managementtechniques These include yoga, meditation, and exercise and can be very helpful when they are practiced regularly. A mental health specialist can help determine which treatment is best for you. Some people see improvement with one therapy. However, other people require a combination of therapies. Document Released: 07/16/2012 Document Reviewed: 07/16/2012 [facility] Patient Information 2014 [place], [place]. Knee Exercises EXERCISES RANGE OF MOTION(ROM) AND STRETCHING EXERCISES These exercises may help you when beginning to rehabilitate your injury. Your symptoms may resolve with or without further involvement from your physician, physical therapist or athletic trainer. While completing these exercises, remember:   Restoring tissue flexibility helps normal motion to return to the joints. This allows healthier, less painful movement and activity.  An effective stretch should be held for at least 30 seconds.  A stretch should never be painful. You should only feel a gentle lengthening or release in the stretched tissue. STRETCH - Knee Extension, Prone  Lie on your stomach on a firm surface, such as a bed or countertop. Place your right / left knee and leg just beyond the edge of the surface. You may wish to place a towel under the far end of your right / left thigh for comfort.  Relax your leg muscles and allow gravity to straighten your knee. Your clinician may advise you to add an ankle weight if  more resistance is helpful for you.  You should feel a stretch in the  back of your right / left knee. Hold this position for __________ seconds. Repeat __________ times. Complete this stretch __________ times per day. * Your physician, physical therapist or athletic trainer may ask you to add ankle weight to enhance your stretch.  RANGE OF MOTION - Knee Flexion, Active  Lie on your back with both knees straight. (If this causes back discomfort, bend your opposite knee, placing your foot flat on the floor.)  Slowly slide your heel back toward your buttocks until you feel a gentle stretch in the front of your knee or thigh.  Hold for __________ seconds. Slowly slide your heel back to the starting position. Repeat __________ times. Complete this exercise __________ times per day.  STRETCH - Quadriceps, Prone   Lie on your stomach on a firm surface, such as a bed or padded floor.  Bend your right / left knee and grasp your ankle. If you are unable to reach, your ankle or pant leg, use a belt around your foot to lengthen your reach.  Gently pull your heel toward your buttocks. Your knee should not slide out to the side. You should feel a stretch in the front of your thigh and/or knee.  Hold this position for __________ seconds. Repeat __________ times. Complete this stretch __________ times per day.  STRETCH  Hamstrings, Supine   Lie on your back. Loop a belt or towel over the ball of your right / left foot.  Straighten your right / left knee and slowly pull on the belt to raise your leg. Do not allow the right / left knee to bend. Keep your opposite leg flat on the floor.  Raise the leg until you feel a gentle stretch behind your right / left knee or thigh. Hold this position for __________ seconds. Repeat __________ times. Complete this stretch __________ times per day.  STRENGTHENING EXERCISES These exercises may help you when beginning to rehabilitate your injury. They may resolve your symptoms with or without further involvement from your physician,  physical therapist or athletic trainer. While completing these exercises, remember:   Muscles can gain both the endurance and the strength needed for everyday activities through controlled exercises.  Complete these exercises as instructed by your physician, physical therapist or athletic trainer. Progress the resistance and repetitions only as guided.  You may experience muscle soreness or fatigue, but the pain or discomfort you are trying to eliminate should never worsen during these exercises. If this pain does worsen, stop and make certain you are following the directions exactly. If the pain is still present after adjustments, discontinue the exercise until you can discuss the trouble with your clinician. STRENGTH - Quadriceps, Isometrics  Lie on your back with your right / left leg extended and your opposite knee bent.  Gradually tense the muscles in the front of your right / left thigh. You should see either your knee cap slide up toward your hip or increased dimpling just above the knee. This motion will push the back of the knee down toward the floor/mat/bed on which you are lying.  Hold the muscle as tight as you can without increasing your pain for __________ seconds.  Relax the muscles slowly and completely in between each repetition. Repeat __________ times. Complete this exercise __________ times per day.  STRENGTH - Quadriceps, Short Arcs   Lie on your back. Place a __________ inch towel roll under your knee so  that the knee slightly bends.  Raise only your lower leg by tightening the muscles in the front of your thigh. Do not allow your thigh to rise.  Hold this position for __________ seconds. Repeat __________ times. Complete this exercise __________ times per day.  OPTIONAL ANKLE WEIGHTS: Begin with ____________________, but DO NOT exceed ____________________. Increase in 1 pound/0.5 kilogram increments.  STRENGTH - Quadriceps, Straight Leg Raises  Quality counts! Watch  for signs that the quadriceps muscle is working to insure you are strengthening the correct muscles and not "cheating" by substituting with healthier muscles.  Lay on your back with your right / left leg extended and your opposite knee bent.  Tense the muscles in the front of your right / left thigh. You should see either your knee cap slide up or increased dimpling just above the knee. Your thigh may even quiver.  Tighten these muscles even more and raise your leg 4 to 6 inches off the floor. Hold for __________ seconds.  Keeping these muscles tense, lower your leg.  Relax the muscles slowly and completely in between each repetition. Repeat __________ times. Complete this exercise __________ times per day.  STRENGTH - Hamstring, Curls  Lay on your stomach with your legs extended. (If you lay on a bed, your feet may hang over the edge.)  Tighten the muscles in the back of your thigh to bend your right / left knee up to 90 degrees. Keep your hips flat on the bed/floor.  Hold this position for __________ seconds.  Slowly lower your leg back to the starting position. Repeat __________ times. Complete this exercise __________ times per day.  OPTIONAL ANKLE WEIGHTS: Begin with ____________________, but DO NOT exceed ____________________. Increase in 1 pound/0.5 kilogram increments.  STRENGTH  Quadriceps, Squats  Stand in a door frame so that your feet and knees are in line with the frame.  Use your hands for balance, not support, on the frame.  Slowly lower your weight, bending at the hips and knees. Keep your lower legs upright so that they are parallel with the door frame. Squat only within the range that does not increase your knee pain. Never let your hips drop below your knees.  Slowly return upright, pushing with your legs, not pulling with your hands. Repeat __________ times. Complete this exercise __________ times per day.  STRENGTH - Quadriceps, Wall Slides  Follow guidelines  for form closely. Increased knee pain often results from poorly placed feet or knees.  Lean against a smooth wall or door and walk your feet out 18-24 inches. Place your feet hip-width apart.  Slowly slide down the wall or door until your knees bend __________ degrees.* Keep your knees over your heels, not your toes, and in line with your hips, not falling to either side.  Hold for __________ seconds. Stand up to rest for __________ seconds in between each repetition. Repeat __________ times. Complete this exercise __________ times per day. * Your physician, physical therapist or athletic trainer will alter this angle based on your symptoms and progress. Document Released: 02/02/2005 Document Revised: 06/13/2011 Document Reviewed: 07/03/2008 [facility] Patient Information 2014 [place], [place].

## 2013-05-29 NOTE — Progress Notes (Signed)
Subjective:    Patient ID: Frank Shaffer, male    DOB: 1980-12-04, 33 y.o.   MRN: [id number]  HPI Comments: 33 yo obese male presents for 3 month F/U for HTN, Cholesterol, Pre-Dm, D. Deficient. He has not been exercising as routinely. He is trying to improve his diet. He keeps busy. He notes BP has been elevated with stress 130-40s/ 80-90s. LAST LABS CPK 481 T 209 TG 71 H 38 L 157 A1C 5.3 D 54 INSULIN 16 He has been off Zetia/ Statins due to elevated CPK. He denies any myalgias other than his knee.  He went to ER on 11/14 for increased stress/ anxiety/ depression. He has not started counseling AD. He notes he has ups/ downs with stress/ anxiety. He thinks he is managing stress better but notes he does not want to increase his medicine.   He has noticed his left knee with pain on/ off, he notes it occasionally feels like it wants to "slip out of joint". He denies HX of injury but notes he sat awkwardly on knee several months ago and thinks the problem started around that time. Pain 4/10 at worse.  He has noticed increase nasal drainage and mild sore throat. He denies colored production.  Hypertension  Hyperlipidemia   Current Outpatient Prescriptions on File Prior to Visit  Medication Sig Dispense Refill  . Cholecalciferol (VITAMIN D3) 5000 UNITS TABS Take 5,000 Units by mouth daily.      . citalopram (CELEXA) 20 MG tablet Take 20 mg by mouth daily.      . Colesevelam HCl 3.75 G PACK Take 1 packet by mouth daily.  30 each  0  . lisinopril (PRINIVIL,ZESTRIL) 40 MG tablet Take 1 tablet (40 mg total) by mouth daily.  90 tablet  0  . propranolol (INDERAL) 20 MG tablet Take 1 tablet (20 mg total) by mouth daily.  90 tablet  1   No current facility-administered medications on file prior to visit.    Allergies  Allergen Reactions  . Statins Other (See Comments)    Myalgias and weakness  . Latex Itching and Rash   Past Medical History  Diagnosis Date  . Hypertension   .  Hyperlipidemia   . Pre-diabetes   . Hemorrhoids   . Depression   . Unspecified vitamin D deficiency   . Obese       Review of Systems  HENT: Positive for postnasal drip and sore throat.   Musculoskeletal: Positive for arthralgias, gait problem and joint swelling.  Psychiatric/Behavioral: Negative for suicidal ideas. The patient is nervous/anxious.   All other systems reviewed and are negative.   BP 142/94  Pulse 94  Temp(Src) 98 F (36.7 C) (Oral)  Resp 18  Ht 6\' 4"  (1.93 m)  Wt 352 lb (159.666 kg)  BMI 42.86 kg/m2 Recheck 138/88    Objective:   Physical Exam  Nursing note and vitals reviewed. Constitutional: He is oriented to person, place, and time. He appears well-developed and well-nourished.  Obese   HENT:  Head: Normocephalic and atraumatic.  Right Ear: External ear normal.  Left Ear: External ear normal.  Nose: Nose normal.  Mouth/Throat: No oropharyngeal exudate.  Post Pharynx with mild erythema   Eyes: Conjunctivae and EOM are normal.  Neck: Normal range of motion. Neck supple. No JVD present. No thyromegaly present.  Cardiovascular: Normal rate, regular rhythm, normal heart sounds and intact distal pulses.   Pulmonary/Chest: Effort normal and breath sounds normal.  Abdominal: Soft. Bowel sounds  are normal. He exhibits no distension and no mass. There is no tenderness. There is no rebound and no guarding.  Musculoskeletal: Normal range of motion. He exhibits edema. He exhibits no tenderness.  Mild anterior left +crepitus bilateral left > right  Lymphadenopathy:    He has no cervical adenopathy.  Neurological: He is alert and oriented to person, place, and time. He has normal reflexes. No cranial nerve deficit. Coordination normal.  Skin: Skin is warm and dry.  Psychiatric: He has a normal mood and affect. His behavior is normal. Judgment and thought content normal.          Assessment & Plan:  1.  3 month F/U for obese, HTN, Cholesterol, Pre-Dm, D.  Deficient. Needs healthy diet, cardio QD and obtain healthy weight. Check Labs, Check BP if >130/80 call office Increase Propranolol to BID and call with results 2. Depression/ anxiety- Declines RX change at this time but will start counseling AD and f/u with results 3. Left knee pain- Refer to Ortho 4. Acute pharyngitis/ Allergic rhinitis- Allegra OTC, increase H2o, allergy hygiene explained. Warm salt water gargles daily. 1 tsp liquid benadryl + 1 tsp liquid Maalox, MIX/ GARGLE/ SPIT as needed for pain. If symptoms increase Zpak AD  OVER 40 minutes of exam, counseling, chart review, referral performed

## 2013-05-30 LAB — HEPATIC FUNCTION PANEL
ALK PHOS: 108 U/L (ref 39–117)
ALT: 31 U/L (ref 0–53)
AST: 28 U/L (ref 0–37)
Albumin: 4.4 g/dL (ref 3.5–5.2)
BILIRUBIN DIRECT: 0.1 mg/dL (ref 0.0–0.3)
BILIRUBIN INDIRECT: 0.5 mg/dL (ref 0.2–1.2)
TOTAL PROTEIN: 7.4 g/dL (ref 6.0–8.3)
Total Bilirubin: 0.6 mg/dL (ref 0.2–1.2)

## 2013-05-30 LAB — LIPID PANEL
Cholesterol: 235 mg/dL — ABNORMAL HIGH (ref 0–200)
HDL: 37 mg/dL — AB (ref >39)
LDL CALC: 181 mg/dL — AB (ref 0–99)
TRIGLYCERIDES: 87 mg/dL (ref <150)
Total CHOL/HDL Ratio: 6.4 Ratio
VLDL: 17 mg/dL (ref 0–40)

## 2013-05-30 LAB — BASIC METABOLIC PANEL WITH GFR
BUN: 15 mg/dL (ref 6–23)
CALCIUM: 9.7 mg/dL (ref 8.4–10.5)
CHLORIDE: 107 meq/L (ref 96–112)
CO2: 21 meq/L (ref 19–32)
CREATININE: 1.1 mg/dL (ref 0.50–1.35)
GFR, EST NON AFRICAN AMERICAN: 88 mL/min
GFR, Est African American: >89 mL/min
Glucose, Bld: 91 mg/dL (ref 70–99)
Potassium: 4.3 mEq/L (ref 3.5–5.3)
SODIUM: 144 meq/L (ref 135–145)

## 2013-05-30 LAB — CK: CK TOTAL: 379 U/L — AB (ref 7–232)

## 2013-05-30 LAB — INSULIN, FASTING: INSULIN FASTING, SERUM: 17 u[IU]/mL (ref 3–28)

## 2013-05-30 LAB — URIC ACID: Uric Acid, Serum: 7.4 mg/dL (ref 4.0–7.8)

## 2013-06-05 ENCOUNTER — Encounter: Payer: Self-pay | Admitting: Emergency Medicine

## 2013-06-05 ENCOUNTER — Other Ambulatory Visit: Payer: Self-pay | Admitting: Emergency Medicine

## 2013-06-05 MED ORDER — PROPRANOLOL HCL 20 MG PO TABS: 20.0000 mg | ORAL_TABLET | Freq: Two times a day (BID) | ORAL | Status: DC

## 2013-06-06 ENCOUNTER — Encounter: Payer: Self-pay | Admitting: Emergency Medicine

## 2013-07-22 ENCOUNTER — Encounter: Payer: Self-pay | Admitting: Emergency Medicine

## 2013-07-23 ENCOUNTER — Other Ambulatory Visit: Payer: Self-pay | Admitting: Physician Assistant

## 2013-07-23 NOTE — Telephone Encounter (Signed)
There are no medications. Mistake for surescripts too?

## 2013-08-17 ENCOUNTER — Other Ambulatory Visit: Payer: Self-pay | Admitting: Emergency Medicine

## 2013-08-23 ENCOUNTER — Encounter: Payer: Self-pay | Admitting: Emergency Medicine

## 2013-08-23 DIAGNOSIS — Z0000 Encounter for general adult medical examination without abnormal findings: Secondary | ICD-10-CM

## 2013-12-17 ENCOUNTER — Other Ambulatory Visit: Payer: Self-pay | Admitting: Emergency Medicine

## 2013-12-18 ENCOUNTER — Other Ambulatory Visit: Payer: Self-pay | Admitting: *Deleted

## 2013-12-18 ENCOUNTER — Other Ambulatory Visit: Payer: Self-pay | Admitting: Emergency Medicine

## 2013-12-18 MED ORDER — PROPRANOLOL HCL 20 MG PO TABS: ORAL_TABLET | ORAL | Status: DC

## 2013-12-26 ENCOUNTER — Other Ambulatory Visit: Payer: Self-pay | Admitting: Internal Medicine

## 2013-12-31 ENCOUNTER — Other Ambulatory Visit: Payer: Self-pay | Admitting: Internal Medicine

## 2014-01-01 ENCOUNTER — Other Ambulatory Visit: Payer: Self-pay | Admitting: Internal Medicine

## 2014-01-15 ENCOUNTER — Ambulatory Visit: Payer: 59 | Admitting: Internal Medicine

## 2014-01-17 ENCOUNTER — Other Ambulatory Visit: Payer: Self-pay

## 2014-01-21 ENCOUNTER — Encounter: Payer: Self-pay | Admitting: Emergency Medicine

## 2014-01-21 ENCOUNTER — Ambulatory Visit (INDEPENDENT_AMBULATORY_CARE_PROVIDER_SITE_OTHER): Payer: 59 | Admitting: Emergency Medicine

## 2014-01-21 VITALS — BP 128/82 | HR 82 | Temp 97.8°F | Resp 18 | Ht 76.0 in | Wt 343.0 lb

## 2014-01-21 DIAGNOSIS — R6889 Other general symptoms and signs: Secondary | ICD-10-CM

## 2014-01-21 DIAGNOSIS — E785 Hyperlipidemia, unspecified: Secondary | ICD-10-CM

## 2014-01-21 DIAGNOSIS — B353 Tinea pedis: Secondary | ICD-10-CM

## 2014-01-21 DIAGNOSIS — Z0000 Encounter for general adult medical examination without abnormal findings: Secondary | ICD-10-CM

## 2014-01-21 DIAGNOSIS — R002 Palpitations: Secondary | ICD-10-CM

## 2014-01-21 DIAGNOSIS — E782 Mixed hyperlipidemia: Secondary | ICD-10-CM

## 2014-01-21 DIAGNOSIS — Z111 Encounter for screening for respiratory tuberculosis: Secondary | ICD-10-CM

## 2014-01-21 DIAGNOSIS — Z0001 Encounter for general adult medical examination with abnormal findings: Secondary | ICD-10-CM

## 2014-01-21 DIAGNOSIS — E669 Obesity, unspecified: Secondary | ICD-10-CM

## 2014-01-21 LAB — CBC WITH DIFFERENTIAL/PLATELET
Basophils Absolute: 0 10*3/uL (ref 0.0–0.1)
Basophils Relative: 0 % (ref 0–1)
EOS PCT: 4 % (ref 0–5)
Eosinophils Absolute: 0.3 10*3/uL (ref 0.0–0.7)
HEMATOCRIT: 47.5 % (ref 39.0–52.0)
HEMOGLOBIN: 16.5 g/dL (ref 13.0–17.0)
LYMPHS ABS: 2.1 10*3/uL (ref 0.7–4.0)
LYMPHS PCT: 26 % (ref 12–46)
MCH: 30.1 pg (ref 26.0–34.0)
MCHC: 34.7 g/dL (ref 30.0–36.0)
MCV: 86.5 fL (ref 78.0–100.0)
MONO ABS: 0.6 10*3/uL (ref 0.1–1.0)
Monocytes Relative: 8 % (ref 3–12)
Neutro Abs: 5 10*3/uL (ref 1.7–7.7)
Neutrophils Relative %: 62 % (ref 43–77)
Platelets: 168 10*3/uL (ref 150–400)
RBC: 5.49 MIL/uL (ref 4.22–5.81)
RDW: 13.1 % (ref 11.5–15.5)
WBC: 8.1 10*3/uL (ref 4.0–10.5)

## 2014-01-21 MED ORDER — LISINOPRIL 40 MG PO TABS: ORAL_TABLET | ORAL | Status: DC

## 2014-01-21 MED ORDER — ALBUTEROL SULFATE HFA 108 (90 BASE) MCG/ACT IN AERS: 1.0000 | INHALATION_SPRAY | Freq: Four times a day (QID) | RESPIRATORY_TRACT | Status: DC | PRN

## 2014-01-21 MED ORDER — COLESEVELAM HCL 3.75 G PO PACK: PACK | ORAL | Status: DC

## 2014-01-21 MED ORDER — PROPRANOLOL HCL 20 MG PO TABS: ORAL_TABLET | ORAL | Status: DC

## 2014-01-21 MED ORDER — CITALOPRAM HYDROBROMIDE 20 MG PO TABS: 20.0000 mg | ORAL_TABLET | Freq: Every day | ORAL | Status: DC

## 2014-01-21 NOTE — Progress Notes (Signed)
Subjective:    Patient ID: Frank Shaffer, male    DOB: Sep 08, 1980, 33 y.o.   MRN: [id number]  HPI Comments: 33 yo AAM CPE and presents for 3 month F/U for HTN, Cholesterol, Pre-Dm, D. Deficient. He is trying to exercise routinely. He is down 20# since last CPE. He is unable to tolerate statins/ RYRE with elevated CPK/ myalgias. He notes occasional palpitations when exercises vigorously but denies CP. He is trying to improve diet as well.  He has noticed increased skin irritation between right 4th-5th toes. He notes initially some pain now with skin changes. He has not tried any OTC. He denies numbness.   WBC             6.9   05/29/2013 HGB            17.0   05/29/2013 HCT            48.5   05/29/2013 PLT             180   05/29/2013 GLUCOSE          91   05/29/2013 CHOL            235   05/29/2013 TRIG             87   05/29/2013 HDL              37   05/29/2013 LDLCALC         181   05/29/2013 ALT              31   05/29/2013 AST              28   05/29/2013 NA              144   05/29/2013 K               4.3   05/29/2013 CL              107   05/29/2013 CREATININE     1.10   05/29/2013 BUN              15   05/29/2013 CO2              21   05/29/2013 HGBA1C          5.5   05/29/2013   Hyperlipidemia Pertinent negatives include no chest pain or shortness of breath.  Hypertension Associated symptoms include palpitations. Pertinent negatives include no chest pain or shortness of breath.     Medication List       This list is accurate as of: 01/21/14 11:59 PM.  Always use your most recent med list.               albuterol 108 (90 BASE) MCG/ACT inhaler  Commonly known as:  PROVENTIL HFA;VENTOLIN HFA  Inhale 1-2 puffs into the lungs every 6 (six) hours as needed for wheezing or shortness of breath.     citalopram 20 MG tablet  Commonly known as:  CELEXA  Take 1 tablet (20 mg total) by mouth daily.     Colesevelam HCl 3.75 G Pack  Commonly known as:  WELCHOL  USE 1 PACKET ONCE  A DAY     lisinopril 40 MG tablet  Commonly known as:  PRINIVIL,ZESTRIL  TAKE 1 TABLET EVERY DAY     propranolol 20 MG tablet  Commonly known as:  INDERAL  TAKE 1 TABLET (20 MG  TOTAL) BY MOUTH 2 (TWO) TIMES DAILY.     Vitamin D3 5000 UNITS Tabs  Take 5,000 Units by mouth daily.       Allergies  Allergen Reactions  . Statins Other (See Comments)    Myalgias and weakness  . Latex Itching and Rash   Past Medical History  Diagnosis Date  . Hypertension   . Hyperlipidemia   . Pre-diabetes   . Hemorrhoids   . Depression   . Unspecified vitamin D deficiency   . Obese    Past Surgical History  Procedure Laterality Date  . Colonoscopy  10/27/2011    Procedure: COLONOSCOPY;  Surgeon: Rachael Fee, MD;  Location: [facility];  Service: Endoscopy;  Laterality: N/A;  pt is above weight limit  . Wisdom tooth extraction     History  Substance Use Topics  . Smoking status: Never Smoker   . Smokeless tobacco: Never Used  . Alcohol Use: No   Family History  Problem Relation Age of Onset  . Hypertension Father   . Heart disease Mother   . Diabetes Father   . Depression Mother   . Thyroid disease Mother   . Hyperlipidemia Brother   . Colon cancer Neg Hx     MAINTENANCE: Colonoscopy:10/2011 EYE: needs to establish Dentist: Q 6 month, 01/22/14 Allergist: yearly  IMMUNIZATIONS: Tdap:2013 Influenza: declines  Patient Care Team: Lucky Cowboy, MD as PCP - General (Internal Medicine) Scharlene Gloss, MD (Allergy and Immunology) Rachael Fee, MD as Attending Physician (Gastroenterology)   Review of Systems  Respiratory: Negative for shortness of breath.   Cardiovascular: Positive for palpitations. Negative for chest pain.  Skin: Positive for wound.  All other systems reviewed and are negative.  BP 128/82  Pulse 82  Temp(Src) 97.8 F (36.6 C) (Temporal)  Resp 18  Ht 6\' 4"  (1.93 m)  Wt 343 lb (155.584 kg)  BMI 41.77 kg/m2     Objective:   Physical  Exam  Nursing note and vitals reviewed. Constitutional: He is oriented to person, place, and time. He appears well-developed and well-nourished.  obese  HENT:  Head: Normocephalic and atraumatic.  Right Ear: External ear normal.  Left Ear: External ear normal.  Nose: Nose normal.  Mouth/Throat: Oropharynx is clear and moist.  Eyes: Conjunctivae and EOM are normal. Pupils are equal, round, and reactive to light. Right eye exhibits no discharge. Left eye exhibits no discharge. No scleral icterus.  Neck: Normal range of motion. Neck supple. No JVD present. No tracheal deviation present. No thyromegaly present.  Cardiovascular: Normal rate, regular rhythm, normal heart sounds and intact distal pulses.   Pulmonary/Chest: Effort normal and breath sounds normal.  Abdominal: Soft. Bowel sounds are normal. He exhibits no distension and no mass. There is no tenderness. There is no rebound and no guarding.  Genitourinary:  def  Musculoskeletal: Normal range of motion. He exhibits no edema and no tenderness.  Lymphadenopathy:    He has no cervical adenopathy.  Neurological: He is alert and oriented to person, place, and time. He has normal reflexes. No cranial nerve deficit. He exhibits normal muscle tone. Coordination normal.  Skin: Skin is warm and dry. No rash noted. No erythema. No pallor.  Between 4th/ 5th right toes skin is emaciated, almost ulcerated  Psychiatric: He has a normal mood and affect. His behavior is normal. Judgment and thought content normal.      EKG NSCSPT WNL     Assessment & Plan:  1. CPE- Update  screening labs/ History/ Immunizations/ Testing as needed. Advised healthy diet, QD exercise, increase H20 and continue RX/ Vitamins AD.  2. Obesity- Continue weight loss, increase activity and better diet. Pt aware of risks. Check labs  3. 3 month F/U for HTN, Cholesterol, Pre-Dm, D. Deficient. Needs healthy diet, cardio QD and obtain healthy weight. Check Labs, Check BP if  >130/80 call office  4. Palpitations- refer Cardio for further eval, w/c if SX increase or ER.   5. Skin changes on foot ? Severe athletes foot-refer podiatry for evaluation/ treatment. Advised soak epsom salts and try OTC gold bond powder after completely dries area, change wet socks frequently

## 2014-01-21 NOTE — Patient Instructions (Signed)
Athlete's Foot  Athlete's foot is a skin infection caused by a fungus. Athlete's foot is often seen between or under the toes. It can also be seen on the bottom of the foot. Athlete's foot can spread to other people by sharing towels or shower stalls. HOME CARE  Only take medicines as told by your doctor. Do not use steroid creams.  Wash your feet daily. Dry your feet well, especially between the toes.  Change your socks every day. Wear cotton or wool socks.  Change your socks 2 to 3 times a day in hot weather.  Wear sandals or canvas tennis shoes with good airflow.  If you have blisters, soak your feet in a solution as told by your doctor. Do this for 20 to 30 minutes, 2 times a day. Dry your feet well after you soak them.  Do not share towels.  Wear sandals when you use shared locker rooms or showers. GET HELP RIGHT AWAY IF:   You have a fever.  Your foot is puffy (swollen), sore, warm, or red.  You are not getting better after 7 days of treatment.  You still have athlete's foot after 30 days.  You have problems caused by your medicine. MAKE SURE YOU:   Understand these instructions.  Will watch your condition.  Will get help right away if you are not doing well or get worse. Document Released: 09/07/2007 Document Revised: 06/13/2011 Document Reviewed: 01/07/2011 [employer] Patient Information 2015 [employer]. This information is not intended to replace advice given to you by your health care provider. Make sure you discuss any questions you have with your health care provider.  

## 2014-01-22 LAB — HEPATIC FUNCTION PANEL
ALT: 26 U/L (ref 0–53)
AST: 29 U/L (ref 0–37)
Albumin: 4.3 g/dL (ref 3.5–5.2)
Alkaline Phosphatase: 108 U/L (ref 39–117)
BILIRUBIN DIRECT: 0.2 mg/dL (ref 0.0–0.3)
BILIRUBIN INDIRECT: 0.6 mg/dL (ref 0.2–1.2)
TOTAL PROTEIN: 7.5 g/dL (ref 6.0–8.3)
Total Bilirubin: 0.8 mg/dL (ref 0.2–1.2)

## 2014-01-22 LAB — BASIC METABOLIC PANEL WITH GFR
BUN: 16 mg/dL (ref 6–23)
CALCIUM: 9.5 mg/dL (ref 8.4–10.5)
CHLORIDE: 104 meq/L (ref 96–112)
CO2: 21 meq/L (ref 19–32)
CREATININE: 1.11 mg/dL (ref 0.50–1.35)
GFR, Est African American: >89 mL/min
GFR, Est Non African American: 86 mL/min
GLUCOSE: 74 mg/dL (ref 70–99)
Potassium: 4 mEq/L (ref 3.5–5.3)
Sodium: 137 mEq/L (ref 135–145)

## 2014-01-22 LAB — HEMOGLOBIN A1C
Hgb A1c MFr Bld: 5.7 % — ABNORMAL HIGH (ref <5.7)
Mean Plasma Glucose: 117 mg/dL — ABNORMAL HIGH (ref <117)

## 2014-01-22 LAB — VITAMIN B12: Vitamin B-12: 449 pg/mL (ref 211–911)

## 2014-01-22 LAB — URINALYSIS, ROUTINE W REFLEX MICROSCOPIC
BILIRUBIN URINE: NEGATIVE
Glucose, UA: NEGATIVE mg/dL
Hgb urine dipstick: NEGATIVE
Ketones, ur: NEGATIVE mg/dL
Leukocytes, UA: NEGATIVE
Nitrite: NEGATIVE
PROTEIN: NEGATIVE mg/dL
Specific Gravity, Urine: 1.027 (ref 1.005–1.030)
UROBILINOGEN UA: 0.2 mg/dL (ref 0.0–1.0)
pH: 5 (ref 5.0–8.0)

## 2014-01-22 LAB — MICROALBUMIN / CREATININE URINE RATIO
Creatinine, Urine: 262.4 mg/dL
Microalb Creat Ratio: 2.7 mg/g (ref 0.0–30.0)
Microalb, Ur: 0.7 mg/dL (ref <2.0)

## 2014-01-22 LAB — LIPID PANEL
CHOLESTEROL: 230 mg/dL — AB (ref 0–200)
HDL: 41 mg/dL (ref >39)
LDL Cholesterol: 176 mg/dL — ABNORMAL HIGH (ref 0–99)
TRIGLYCERIDES: 66 mg/dL (ref <150)
Total CHOL/HDL Ratio: 5.6 Ratio
VLDL: 13 mg/dL (ref 0–40)

## 2014-01-22 LAB — INSULIN, FASTING: Insulin fasting, serum: 9.8 u[IU]/mL (ref 2.0–19.6)

## 2014-01-22 LAB — VITAMIN D 25 HYDROXY (VIT D DEFICIENCY, FRACTURES): Vit D, 25-Hydroxy: 40 ng/mL (ref 30–89)

## 2014-01-22 LAB — MAGNESIUM: MAGNESIUM: 1.8 mg/dL (ref 1.5–2.5)

## 2014-01-22 LAB — TESTOSTERONE: TESTOSTERONE: 502 ng/dL (ref 300–890)

## 2014-01-22 LAB — TSH: TSH: 1.276 u[IU]/mL (ref 0.350–4.500)

## 2014-01-24 LAB — TB SKIN TEST
Induration: 0 mm
TB SKIN TEST: NEGATIVE

## 2014-01-29 ENCOUNTER — Ambulatory Visit: Payer: 59 | Admitting: Internal Medicine

## 2014-01-29 ENCOUNTER — Encounter: Payer: Self-pay | Admitting: Podiatry

## 2014-01-29 ENCOUNTER — Ambulatory Visit (INDEPENDENT_AMBULATORY_CARE_PROVIDER_SITE_OTHER): Payer: 59 | Admitting: Podiatry

## 2014-01-29 VITALS — BP 142/93 | HR 86 | Resp 16

## 2014-01-29 DIAGNOSIS — M2041 Other hammer toe(s) (acquired), right foot: Secondary | ICD-10-CM

## 2014-01-29 DIAGNOSIS — L84 Corns and callosities: Secondary | ICD-10-CM

## 2014-01-29 DIAGNOSIS — M779 Enthesopathy, unspecified: Secondary | ICD-10-CM

## 2014-01-29 MED ORDER — TRIAMCINOLONE ACETONIDE 10 MG/ML IJ SUSP
10.0000 mg | Freq: Once | INTRAMUSCULAR | Status: AC
  Administered 2014-01-29: 10 mg

## 2014-01-29 NOTE — Progress Notes (Signed)
   Subjective:    Patient ID: Frank Shaffer, male    DOB: 05/05/1980, 33 y.o.   MRN: [id number]  HPI Comments: "I have a place in between my toes"  Patient c/o aching, burning between 4th and 5th toes right for about 3 weeks. The area is red and macerated. He's bee soaking and using powder.     Review of Systems  Respiratory: Positive for wheezing.   Cardiovascular: Positive for palpitations.  Skin: Positive for wound.  All other systems reviewed and are negative.      Objective:   Physical Exam        Assessment & Plan:

## 2014-01-30 NOTE — Progress Notes (Signed)
Subjective:     Patient ID: Frank Shaffer, male   DOB: February 26, 1981, 33 y.o.   MRN: [id number]  HPI patient points to the right foot stating I'm having a lot of pain between my fourth and fifth toes for about a month. I've tried powders and creams without relief   Review of Systems  All other systems reviewed and are negative.      Objective:   Physical Exam  Nursing note and vitals reviewed. Constitutional: He is oriented to person, place, and time.  Cardiovascular: Intact distal pulses.   Musculoskeletal: Normal range of motion.  Neurological: He is oriented to person, place, and time.  Skin: Skin is warm.   neurovascular status found to be intact with muscle strength adequate range of motion of the subtalar and midtarsal joint within normal limits. Patient's found to be well perfused with good cap fill time and is noted to be well oriented 3 and has a keratotic lesion on both the inside of the fifth toe right and on the fourth webspace right that are painful when pressed with fluid buildup noted     Assessment:     Probable interdigital keratotic lesion with bone against bone compression creating inflammatory changes    Plan:     H&P and condition discussed with patient. At this time I did a small capsular injection 2 mg dexamethasone Kenalog 5 mg Xylocaine and after appropriate numbness I debrided the lesions and applied padding. I instructed that if symptoms persist or come back to want to see him back again and hopefully this will resolve his condition

## 2014-02-05 ENCOUNTER — Ambulatory Visit: Payer: 59 | Admitting: Podiatry

## 2014-05-22 ENCOUNTER — Encounter: Payer: Self-pay | Admitting: *Deleted

## 2014-06-04 ENCOUNTER — Other Ambulatory Visit: Payer: Self-pay | Admitting: Emergency Medicine

## 2014-07-02 ENCOUNTER — Other Ambulatory Visit: Payer: Self-pay | Admitting: Emergency Medicine

## 2014-07-15 ENCOUNTER — Encounter: Payer: Self-pay | Admitting: Emergency Medicine

## 2014-07-15 ENCOUNTER — Ambulatory Visit (INDEPENDENT_AMBULATORY_CARE_PROVIDER_SITE_OTHER): Payer: 59 | Admitting: Emergency Medicine

## 2014-07-15 VITALS — BP 130/72 | HR 86 | Temp 98.2°F | Resp 18 | Ht 76.0 in | Wt 353.0 lb

## 2014-07-15 DIAGNOSIS — R739 Hyperglycemia, unspecified: Secondary | ICD-10-CM

## 2014-07-15 DIAGNOSIS — R7309 Other abnormal glucose: Secondary | ICD-10-CM

## 2014-07-15 DIAGNOSIS — I10 Essential (primary) hypertension: Secondary | ICD-10-CM

## 2014-07-15 DIAGNOSIS — R899 Unspecified abnormal finding in specimens from other organs, systems and tissues: Secondary | ICD-10-CM

## 2014-07-15 DIAGNOSIS — E782 Mixed hyperlipidemia: Secondary | ICD-10-CM

## 2014-07-15 LAB — HEMOGLOBIN A1C
HEMOGLOBIN A1C: 5.7 % — AB (ref <5.7)
Mean Plasma Glucose: 117 mg/dL — ABNORMAL HIGH (ref <117)

## 2014-07-15 LAB — CBC WITH DIFFERENTIAL/PLATELET
BASOS ABS: 0.1 10*3/uL (ref 0.0–0.1)
BASOS PCT: 1 % (ref 0–1)
Eosinophils Absolute: 0.2 10*3/uL (ref 0.0–0.7)
Eosinophils Relative: 4 % (ref 0–5)
HCT: 45.5 % (ref 39.0–52.0)
Hemoglobin: 15.7 g/dL (ref 13.0–17.0)
LYMPHS PCT: 27 % (ref 12–46)
Lymphs Abs: 1.6 10*3/uL (ref 0.7–4.0)
MCH: 29.7 pg (ref 26.0–34.0)
MCHC: 34.5 g/dL (ref 30.0–36.0)
MCV: 86 fL (ref 78.0–100.0)
MONO ABS: 0.5 10*3/uL (ref 0.1–1.0)
MPV: 12.3 fL (ref 8.6–12.4)
Monocytes Relative: 8 % (ref 3–12)
NEUTROS ABS: 3.5 10*3/uL (ref 1.7–7.7)
NEUTROS PCT: 60 % (ref 43–77)
PLATELETS: 189 10*3/uL (ref 150–400)
RBC: 5.29 MIL/uL (ref 4.22–5.81)
RDW: 13.1 % (ref 11.5–15.5)
WBC: 5.9 10*3/uL (ref 4.0–10.5)

## 2014-07-15 MED ORDER — LISINOPRIL 40 MG PO TABS: ORAL_TABLET | ORAL | Status: DC

## 2014-07-15 MED ORDER — COLESEVELAM HCL 3.75 G PO PACK: PACK | ORAL | Status: DC

## 2014-07-15 MED ORDER — PROPRANOLOL HCL 20 MG PO TABS: ORAL_TABLET | ORAL | Status: DC

## 2014-07-15 MED ORDER — CITALOPRAM HYDROBROMIDE 20 MG PO TABS: 20.0000 mg | ORAL_TABLET | Freq: Every day | ORAL | Status: DC

## 2014-07-15 NOTE — Patient Instructions (Addendum)
Probiotics for stomach protection with recent antibiotics   ADD CoQ10 to help with myalgias with cholesterol and schedule 1 month recheck CPK and aldolase after baseline labs today and new trial of Livalo 4 mg start 1/2 = 2mg      Bad carbs also include fruit juice, alcohol, and sweet tea. These are empty calories that do not signal to your brain that you are full.   Please remember the good carbs are still carbs which convert into sugar. So please measure them out no more than 1/2-1 cup of rice, oatmeal, pasta, and beans  Veggies are however free foods! Pile them on.   Not all fruit is created equal. Please see the list below, the fruit at the bottom is higher in sugars than the fruit at the top. Please avoid all dried fruits.   Fat and Cholesterol Control Diet Your diet has an affect on your fat and cholesterol levels in your blood and organs. Too much fat and cholesterol in your blood can affect your:  Heart.  Blood vessels (arteries, veins).  Gallbladder.  Liver.  Pancreas. CONTROL FAT AND CHOLESTEROL WITH DIET Certain foods raise cholesterol and others lower it. It is important to replace bad fats with other types of fat.  Do not eat:  Fatty meats, such as hot dogs and salami.  Stick margarine and some tub margarines that have "partially hydrogenated oils" in them.  Baked goods, such as cookies and crackers that have "partially hydrogenated oils" in them.  Saturated tropical oils, such as coconut and palm oil. Eat the following foods:  Round or loin cuts of red meat.  Chicken (without skin).  Fish.  Veal.  Ground [place] breast.  Shellfish.  Fruit, such as apples.  Vegetables, such as broccoli, potatoes, and carrots.  Beans, peas, and lentils (legumes).  Grains, such as barley, rice, couscous, and bulgar wheat.  Pasta (without cream sauces). Look for foods that are nonfat, low in fat, and low in cholesterol.  FIND FOODS THAT ARE LOWER IN FAT AND  CHOLESTEROL  Find foods with soluble fiber and plant sterols (phytosterol). You should eat 2 grams a day of these foods. These foods include:  Fruits.  Vegetables.  Whole grains.  Dried beans and peas.  Nuts and seeds.  Read package labels. Look for low-saturated fats, trans fat free, low-fat foods.  Choose cheese that have only 2 to 3 grams of saturated fat per ounce.  Use heart-healthy tub margarine that is free of trans fat or partially hydrogenated oil.  Avoid buying baked goods that have partially hydrogenated oils in them. Instead, buy baked goods made with whole grains (whole-wheat or whole oat flour). Avoid baked goods labeled with "flour" or "enriched flour."  Buy non-creamy canned soups with reduced salt and no added fats. PREPARING YOUR FOOD  Broil, bake, steam, or roast foods. Do not fry food.  Use non-stick cooking sprays.  Use lemon or herbs to flavor food instead of using butter or stick margarine.  Use nonfat yogurt, salsa, or low-fat dressings for salads. LOW-SATURATED FAT / LOW-FAT FOOD SUBSTITUTES  Meats / Saturated Fat (g)  Avoid: Steak, marbled (3 oz/85 g) / 11 g.  Choose: Steak, lean (3 oz/85 g) / 4 g.  Avoid: Hamburger (3 oz/85 g) / 7 g.  Choose: Hamburger, lean (3 oz/85 g) / 5 g.  Avoid: Ham (3 oz/85 g) / 6 g.  Choose: Ham, lean cut (3 oz/85 g) / 2.4 g.  Avoid: Chicken, with skin, dark meat (  3 oz/85 g) / 4 g.  Choose: Chicken, skin removed, dark meat (3 oz/85 g) / 2 g.  Avoid: Chicken, with skin, light meat (3 oz/85 g) / 2.5 g.  Choose: Chicken, skin removed, light meat (3 oz/85 g) / 1 g. Dairy / Saturated Fat (g)  Avoid: Whole milk (1 cup) / 5 g.  Choose: Low-fat milk, 2% (1 cup) / 3 g.  Choose: Low-fat milk, 1% (1 cup) / 1.5 g.  Choose: Skim milk (1 cup) / 0.3 g.  Avoid: Hard cheese (1 oz/28 g) / 6 g.  Choose: Skim milk cheese (1 oz/28 g) / 2 to 3 g.  Avoid: Cottage cheese, 4% fat (1 cup) / 6.5 g.  Choose: Low-fat cottage  cheese, 1% fat (1 cup) / 1.5 g.  Avoid: Ice cream (1 cup) / 9 g.  Choose: Sherbet (1 cup) / 2.5 g.  Choose: Nonfat frozen yogurt (1 cup) / 0.3 g.  Choose: Frozen fruit bar / trace.  Avoid: Whipped cream (1 tbs) / 3.5 g.  Choose: Nondairy whipped topping (1 tbs) / 1 g. Condiments / Saturated Fat (g)  Avoid: Mayonnaise (1 tbs) / 2 g.  Choose: Low-fat mayonnaise (1 tbs) / 1 g.  Avoid: Butter (1 tbs) / 7 g.  Choose: Extra light margarine (1 tbs) / 1 g.  Avoid: Coconut oil (1 tbs) / 11.8 g.  Choose: Olive oil (1 tbs) / 1.8 g.  Choose: Corn oil (1 tbs) / 1.7 g.  Choose: Safflower oil (1 tbs) / 1.2 g.  Choose: Sunflower oil (1 tbs) / 1.4 g.  Choose: Soybean oil (1 tbs) / 2.4 g .  Choose: Canola oil (1 tbs) / 1 g. Document Released: 09/20/2011 Document Revised: 11/21/2012 Document Reviewed: 06/20/2013 [facility] Patient Information 2015 [place], [place]. This information is not intended to replace advice given to you by your health care provider. Make sure you discuss any questions you have with your health care provider.

## 2014-07-15 NOTE — Progress Notes (Signed)
Subjective:    Patient ID: Frank Shaffer, male    DOB: [birth year], 34 y.o.   MRN: [id number]  HPI Comments: 34 yo AAM presents for 3 month F/U for HTN, Cholesterol, Pre-Dm, D. Deficient. He is not eating as healthy. He exercise 3 x week.  He has tried statins but d/c with myalgias and elevated CPK 481. He has been on Crestor, Simcor, Zetia, RYRE and currently Welchol.  He has noted BP good at home 120/80s.    Last couple days with abdomen cramping and then immediate formed bowel movement with new RX AMoxicillin for bad tooth for over 1 month. He denies diarrhea/ fever. No oTC he has been off antibiotics x 5 days. No past hx of C-diff  Denies recent Asthma flare with pollen or need for refill of inhaler. He notes   Lab Results      Component                Value               Date                      WBC                      8.1                 01/21/2014                HGB                      16.5                01/21/2014                HCT                      47.5                01/21/2014                PLT                      168                 01/21/2014                GLUCOSE                  74                  01/21/2014                CHOL                     230*                01/21/2014                TRIG                     66                  01/21/2014                HDL  41                  01/21/2014                LDLCALC                  176*                01/21/2014                ALT                      26                  01/21/2014                AST                      29                  01/21/2014                NA                       137                 01/21/2014                K                        4.0                 01/21/2014                CL                       104                 01/21/2014                CREATININE               1.11                01/21/2014                BUN                      16                   01/21/2014                CO2                      21                  01/21/2014                TSH                      1.276               01/21/2014                HGBA1C                   5.7*  01/21/2014                MICROALBUR               0.7                 01/21/2014                Medication List       This list is accurate as of: 07/15/14  9:03 AM.  Always use your most recent med list.               albuterol 108 (90 BASE) MCG/ACT inhaler  Commonly known as:  PROVENTIL HFA;VENTOLIN HFA  Inhale 1-2 puffs into the lungs every 6 (six) hours as needed for wheezing or shortness of breath.     citalopram 20 MG tablet  Commonly known as:  CELEXA  Take 1 tablet (20 mg total) by mouth daily.     lisinopril 40 MG tablet  Commonly known as:  PRINIVIL,ZESTRIL  TAKE 1 TABLET EVERY DAY     propranolol 20 MG tablet  Commonly known as:  INDERAL  TAKE 1 TABLET (20 MG TOTAL) BY MOUTH 2 (TWO) TIMES DAILY.     Vitamin D3 5000 UNITS Tabs  Take 5,000 Units by mouth daily.     WELCHOL 3.75 G Pack  Generic drug:  Colesevelam HCl  USE 1 PACKET ONCE A DAY       Allergies  Allergen Reactions  . Statins Other (See Comments)    Myalgias and weakness  . Latex Itching and Rash   Past Medical History  Diagnosis Date  . Hypertension   . Hyperlipidemia   . Pre-diabetes   . Hemorrhoids   . Depression   . Unspecified vitamin D deficiency   . Obese      Review of Systems  Constitutional: Negative for fever.  Respiratory: Negative for shortness of breath.   Cardiovascular: Positive for chest pain.  Gastrointestinal: Positive for abdominal pain.  Musculoskeletal: Negative for myalgias.  All other systems reviewed and are negative.  BP 130/72 mmHg  Pulse 86  Temp(Src) 98.2 F (36.8 C) (Temporal)  Resp 18  Ht  (1.93 m)  Wt 353 lb (160.12 kg)  BMI 42.99 kg/m2     Objective:   Physical Exam  Constitutional: He appears well-developed and  well-nourished.  HENT:  Head: Normocephalic and atraumatic.  Right Ear: External ear normal.  Left Ear: External ear normal.  Nose: Nose normal.  Mouth/Throat: Oropharynx is clear and moist.  Eyes: Conjunctivae and EOM are normal.  Neck: Normal range of motion. Neck supple. No JVD present. No thyromegaly present.  Cardiovascular: Normal rate, regular rhythm, normal heart sounds and intact distal pulses.   Pulmonary/Chest: Effort normal and breath sounds normal.  Abdominal: Soft. Bowel sounds are normal. He exhibits no distension. There is no tenderness. There is no guarding.  Musculoskeletal: Normal range of motion. He exhibits no edema or tenderness.  Lymphadenopathy:    He has no cervical adenopathy.  Neurological: He is alert.  Skin: Skin is warm and dry. No rash noted.  Psychiatric: He has a normal mood and affect. His behavior is normal. Judgment and thought content normal.  Nursing note and vitals reviewed.       Assessment & Plan:  1.  3 month F/U for HTN, Cholesterol, Pre-Dm, D. Deficient. Needs healthy diet, cardio QD and obtain healthy weight. Check Labs, Check BP if >130/80 call office .ADD CoQ10  to help with myalgias with cholesterol and schedule 1 month recheck CPK and aldolase after baseline labs today and new trial of Livalo 4 mg start 1/2 = . Samples requested and will be given once arrived. Refilled all RX AD.   2. Abdomen pain with recent antibiotics- Advised close monitoring for C-diff. Add probiotics if any increase in symptoms ER.  3. Anxiety- Controlled currently, continue RX AD w/c if SX increase or ER, recommend counseling if symptoms continue

## 2014-07-16 ENCOUNTER — Other Ambulatory Visit: Payer: Self-pay | Admitting: Emergency Medicine

## 2014-07-16 LAB — HEPATIC FUNCTION PANEL
ALT: 28 U/L (ref 0–53)
AST: 21 U/L (ref 0–37)
Albumin: 3.8 g/dL (ref 3.5–5.2)
Alkaline Phosphatase: 105 U/L (ref 39–117)
BILIRUBIN DIRECT: 0.1 mg/dL (ref 0.0–0.3)
BILIRUBIN TOTAL: 0.5 mg/dL (ref 0.2–1.2)
Indirect Bilirubin: 0.4 mg/dL (ref 0.2–1.2)
Total Protein: 6.9 g/dL (ref 6.0–8.3)

## 2014-07-16 LAB — BASIC METABOLIC PANEL WITH GFR
BUN: 15 mg/dL (ref 6–23)
CHLORIDE: 106 meq/L (ref 96–112)
CO2: 20 mEq/L (ref 19–32)
Calcium: 8.8 mg/dL (ref 8.4–10.5)
Creat: 1 mg/dL (ref 0.50–1.35)
GLUCOSE: 98 mg/dL (ref 70–99)
Potassium: 4.3 mEq/L (ref 3.5–5.3)
SODIUM: 138 meq/L (ref 135–145)

## 2014-07-16 LAB — CK: Total CK: 318 U/L — ABNORMAL HIGH (ref 7–232)

## 2014-07-16 LAB — LIPID PANEL
Cholesterol: 223 mg/dL — ABNORMAL HIGH (ref 0–200)
HDL: 39 mg/dL — AB (ref >=40)
LDL CALC: 173 mg/dL — AB (ref 0–99)
TRIGLYCERIDES: 56 mg/dL (ref <150)
Total CHOL/HDL Ratio: 5.7 Ratio
VLDL: 11 mg/dL (ref 0–40)

## 2014-07-16 LAB — INSULIN, FASTING: Insulin fasting, serum: 16.9 u[IU]/mL (ref 2.0–19.6)

## 2014-07-17 LAB — ALDOLASE: ALDOLASE: 5 U/L (ref <=8.1)

## 2014-07-22 ENCOUNTER — Encounter: Payer: Self-pay | Admitting: *Deleted

## 2014-07-31 ENCOUNTER — Encounter: Payer: Self-pay | Admitting: *Deleted

## 2014-08-27 ENCOUNTER — Encounter: Payer: Self-pay | Admitting: Emergency Medicine

## 2014-12-31 ENCOUNTER — Ambulatory Visit (INDEPENDENT_AMBULATORY_CARE_PROVIDER_SITE_OTHER): Payer: Commercial Managed Care - HMO | Admitting: Family

## 2014-12-31 ENCOUNTER — Encounter: Payer: Self-pay | Admitting: Family

## 2014-12-31 ENCOUNTER — Other Ambulatory Visit (INDEPENDENT_AMBULATORY_CARE_PROVIDER_SITE_OTHER): Payer: Commercial Managed Care - HMO

## 2014-12-31 VITALS — BP 138/80 | HR 82 | Temp 98.6°F | Resp 18 | Ht 76.0 in | Wt 356.0 lb

## 2014-12-31 DIAGNOSIS — Z0000 Encounter for general adult medical examination without abnormal findings: Secondary | ICD-10-CM

## 2014-12-31 LAB — LIPID PANEL
CHOL/HDL RATIO: 6
Cholesterol: 215 mg/dL — ABNORMAL HIGH (ref 0–200)
HDL: 33.6 mg/dL — ABNORMAL LOW (ref >39.00)
LDL Cholesterol: 164 mg/dL — ABNORMAL HIGH (ref 0–99)
NONHDL: 181.18
Triglycerides: 84 mg/dL (ref 0.0–149.0)
VLDL: 16.8 mg/dL (ref 0.0–40.0)

## 2014-12-31 LAB — COMPREHENSIVE METABOLIC PANEL
ALK PHOS: 107 U/L (ref 39–117)
ALT: 25 U/L (ref 0–53)
AST: 23 U/L (ref 0–37)
Albumin: 3.9 g/dL (ref 3.5–5.2)
BUN: 19 mg/dL (ref 6–23)
CO2: 22 mEq/L (ref 19–32)
Calcium: 9.3 mg/dL (ref 8.4–10.5)
Chloride: 108 mEq/L (ref 96–112)
Creatinine, Ser: 1.1 mg/dL (ref 0.40–1.50)
GFR: 97.9 mL/min (ref >60.00)
GLUCOSE: 108 mg/dL — AB (ref 70–99)
POTASSIUM: 4.2 meq/L (ref 3.5–5.1)
SODIUM: 138 meq/L (ref 135–145)
TOTAL PROTEIN: 7.4 g/dL (ref 6.0–8.3)
Total Bilirubin: 0.5 mg/dL (ref 0.2–1.2)

## 2014-12-31 LAB — CBC
HEMATOCRIT: 47.8 % (ref 39.0–52.0)
HEMOGLOBIN: 16 g/dL (ref 13.0–17.0)
MCHC: 33.5 g/dL (ref 30.0–36.0)
MCV: 89.2 fl (ref 78.0–100.0)
Platelets: 142 10*3/uL — ABNORMAL LOW (ref 150.0–400.0)
RBC: 5.36 Mil/uL (ref 4.22–5.81)
RDW: 13.2 % (ref 11.5–15.5)
WBC: 7.1 10*3/uL (ref 4.0–10.5)

## 2014-12-31 LAB — TSH: TSH: 0.91 u[IU]/mL (ref 0.35–4.50)

## 2014-12-31 LAB — HEMOGLOBIN A1C: Hgb A1c MFr Bld: 5.6 % (ref 4.6–6.5)

## 2014-12-31 MED ORDER — PROPRANOLOL HCL 20 MG PO TABS: ORAL_TABLET | ORAL | Status: DC

## 2014-12-31 MED ORDER — LISINOPRIL 40 MG PO TABS: ORAL_TABLET | ORAL | Status: DC

## 2014-12-31 NOTE — Progress Notes (Signed)
Pre visit review using our clinic review tool, if applicable. No additional management support is needed unless otherwise documented below in the visit note. 

## 2014-12-31 NOTE — Assessment & Plan Note (Addendum)
1) Anticipatory Guidance: Discussed importance of wearing a seatbelt while driving and not texting while driving; changing batteries in smoke detector at least once annually; wearing suntan lotion when outside; eating a balanced and moderate diet; getting physical activity at least 30 minutes per day.  2) Immunizations / Screenings / Labs:  Declined flu shot. All other immunizations are up to date per recommendations. Due for a vision screen which will be scheduled independently. All other screenings are up to date per recommendations. Obtain CBC, BMET, A1c, Lipid profile, Vitamin D,  and TSH.   Overall well exam. Risk factors for cardiovascular disease include hypertension, hyperlipidemia, obesity and sedentary lifestyle. Hypertension is currently adequately controlled with blood pressure below goal of 140/90. Continue current dosage of lisinopril. Hyperlipidemia still remains elevated with Welchol and notes intolerance to statins. Obesity and sedentary risk factors can be adjusted and improved with nutrient dense foods, decreased processed and sugary foods and increased physical activity. Recommend goal weight loss of 5-10% of current body weight. Discussed the importance of nutrition and exercise in reducing risk factors. Continue other healthy lifestyle behaviors and follow up prevention exam in 1 year. Follow up office visit pending lab work.

## 2014-12-31 NOTE — Patient Instructions (Addendum)
Thank you for choosing [facility].  Summary/Instructions:  Please stop by the lab on the basement level of the building for your blood work. Your results will be released to MyChart (or called to you) after review, usually within 72 hours after test completion. If any changes need to be made, you will be notified at that same time.  If your symptoms worsen or fail to improve, please contact our office for further instruction, or in case of emergency go directly to the emergency room at the closest medical facility.   Health Maintenance A healthy lifestyle and preventative care can promote health and wellness.  Maintain regular health, dental, and eye exams.  Eat a healthy diet. Foods like vegetables, fruits, whole grains, low-fat dairy products, and lean protein foods contain the nutrients you need and are low in calories. Decrease your intake of foods high in solid fats, added sugars, and salt. Get information about a proper diet from your health care provider, if necessary.  Regular physical exercise is one of the most important things you can do for your health. Most adults should get at least 150 minutes of moderate-intensity exercise (any activity that increases your heart rate and causes you to sweat) each week. In addition, most adults need muscle-strengthening exercises on 2 or more days a week.   Maintain a healthy weight. The body mass index (BMI) is a screening tool to identify possible weight problems. It provides an estimate of body fat based on height and weight. Your health care provider can find your BMI and can help you achieve or maintain a healthy weight. For males 20 years and older:  A BMI below 18.5 is considered underweight.  A BMI of 18.5 to 24.9 is normal.  A BMI of 25 to 29.9 is considered overweight.  A BMI of 30 and above is considered obese.  Maintain normal blood lipids and cholesterol by exercising and minimizing your intake of saturated fat. Eat a  balanced diet with plenty of fruits and vegetables. Blood tests for lipids and cholesterol should begin at age 20 and be repeated every 5 years. If your lipid or cholesterol levels are high, you are over age 50, or you are at high risk for heart disease, you may need your cholesterol levels checked more frequently.Ongoing high lipid and cholesterol levels should be treated with medicines if diet and exercise are not working.  If you smoke, find out from your health care provider how to quit. If you do not use tobacco, do not start.  Lung cancer screening is recommended for adults aged 55-80 years who are at high risk for developing lung cancer because of a history of smoking. A yearly low-dose CT scan of the lungs is recommended for people who have at least a 30-pack-year history of smoking and are current smokers or have quit within the past 15 years. A pack year of smoking is smoking an average of 1 pack of cigarettes a day for 1 year (for example, a 30-pack-year history of smoking could mean smoking 1 pack a day for 30 years or 2 packs a day for 15 years). Yearly screening should continue until the smoker has stopped smoking for at least 15 years. Yearly screening should be stopped for people who develop a health problem that would prevent them from having lung cancer treatment.  If you choose to drink alcohol, do not have more than 2 drinks per day. One drink is considered to be 12 oz (360 mL) of beer,   5 oz (150 mL) of wine, or 1.5 oz (45 mL) of liquor.  Avoid the use of street drugs. Do not share needles with anyone. Ask for help if you need support or instructions about stopping the use of drugs.  High blood pressure causes heart disease and increases the risk of stroke. Blood pressure should be checked at least every 1-2 years. Ongoing high blood pressure should be treated with medicines if weight loss and exercise are not effective.  If you are 45-79 years old, ask your health care provider if  you should take aspirin to prevent heart disease.  Diabetes screening involves taking a blood sample to check your fasting blood sugar level. This should be done once every 3 years after age 45 if you are at a normal weight and without risk factors for diabetes. Testing should be considered at a younger age or be carried out more frequently if you are overweight and have at least 1 risk factor for diabetes.  Colorectal cancer can be detected and often prevented. Most routine colorectal cancer screening begins at the age of 50 and continues through age 75. However, your health care provider may recommend screening at an earlier age if you have risk factors for colon cancer. On a yearly basis, your health care provider may provide home test kits to check for hidden blood in the stool. A small camera at the end of a tube may be used to directly examine the colon (sigmoidoscopy or colonoscopy) to detect the earliest forms of colorectal cancer. Talk to your health care provider about this at age 50 when routine screening begins. A direct exam of the colon should be repeated every 5-10 years through age 75, unless early forms of precancerous polyps or small growths are found.  People who are at an increased risk for hepatitis B should be screened for this virus. You are considered at high risk for hepatitis B if:  You were born in a country where hepatitis B occurs often. Talk with your health care provider about which countries are considered high risk.  Your parents were born in a high-risk country and you have not received a shot to protect against hepatitis B (hepatitis B vaccine).  You have HIV or AIDS.  You use needles to inject street drugs.  You live with, or have sex with, someone who has hepatitis B.  You are a man who has sex with other men (MSM).  You get hemodialysis treatment.  You take certain medicines for conditions like cancer, organ transplantation, and autoimmune  conditions.  Hepatitis C blood testing is recommended for all people born from 1945 through 1965 and any individual with known risk factors for hepatitis C.  Healthy men should no longer receive prostate-specific antigen (PSA) blood tests as part of routine cancer screening. Talk to your health care provider about prostate cancer screening.  Testicular cancer screening is not recommended for adolescents or adult males who have no symptoms. Screening includes self-exam, a health care provider exam, and other screening tests. Consult with your health care provider about any symptoms you have or any concerns you have about testicular cancer.  Practice safe sex. Use condoms and avoid high-risk sexual practices to reduce the spread of sexually transmitted infections (STIs).  You should be screened for STIs, including gonorrhea and chlamydia if:  You are sexually active and are younger than 24 years.  You are older than 24 years, and your health care provider tells you that you are at   risk for this type of infection.  Your sexual activity has changed since you were last screened, and you are at an increased risk for chlamydia or gonorrhea. Ask your health care provider if you are at risk.  If you are at risk of being infected with HIV, it is recommended that you take a prescription medicine daily to prevent HIV infection. This is called pre-exposure prophylaxis (PrEP). You are considered at risk if:  You are a man who has sex with other men (MSM).  You are a heterosexual man who is sexually active with multiple partners.  You take drugs by injection.  You are sexually active with a partner who has HIV.  Talk with your health care provider about whether you are at high risk of being infected with HIV. If you choose to begin PrEP, you should first be tested for HIV. You should then be tested every 3 months for as long as you are taking PrEP.  Use sunscreen. Apply sunscreen liberally and  repeatedly throughout the day. You should seek shade when your shadow is shorter than you. Protect yourself by wearing long sleeves, pants, a wide-brimmed hat, and sunglasses year round whenever you are outdoors.  Tell your health care provider of new moles or changes in moles, especially if there is a change in shape or color. Also, tell your health care provider if a mole is larger than the size of a pencil eraser.  A one-time screening for abdominal aortic aneurysm (AAA) and surgical repair of large AAAs by ultrasound is recommended for men aged 65-75 years who are current or former smokers.  Stay current with your vaccines (immunizations). Document Released: 09/17/2007 Document Revised: 03/26/2013 Document Reviewed: 08/16/2010 [employer] Patient Information 2015 [employer]. This information is not intended to replace advice given to you by your health care provider. Make sure you discuss any questions you have with your health care provider.  

## 2014-12-31 NOTE — Progress Notes (Signed)
Subjective:    Patient ID: Frank Shaffer, male    DOB: 07-Oct-1980, 34 y.o.   MRN: [id number]  Chief Complaint  Patient presents with  . Establish Care    CPE, Fasting    HPI:  Frank Shaffer is a 34 y.o. male who presents today for an annual wellness visit.   1) Health Maintenance -   Diet - Averages about 2 meals per day consisting of chicken, vegetables, some pork, occasional fruit, minimal dairy; Caffeine of about 2-3 cups per day.   Exercise - No structured exercise currently. Working on re-establishing work-out regimen.    2) Preventative Exams / Immunizations:  Dental -- Up to date  Vision -- Due for exam   Health Maintenance  Topic Date Due  . HIV Screening  11/14/1994  . INFLUENZA VACCINE  12/17/2015 (Originally 11/03/2014)  . TETANUS/TDAP  05/10/2021    Immunization History  Administered Date(s) Administered  . PPD Test 01/21/2014  . Tdap 05/11/2011   Allergies  Allergen Reactions  . Statins Other (See Comments)    Myalgias and weakness  . Latex Itching and Rash     Outpatient Prescriptions Prior to Visit  Medication Sig Dispense Refill  . albuterol (PROVENTIL HFA;VENTOLIN HFA) 108 (90 BASE) MCG/ACT inhaler Inhale 1-2 puffs into the lungs every 6 (six) hours as needed for wheezing or shortness of breath. 18 g 3  . Cholecalciferol (VITAMIN D3) 5000 UNITS TABS Take 5,000 Units by mouth daily.    . citalopram (CELEXA) 20 MG tablet Take 1 tablet (20 mg total) by mouth daily. 90 tablet 1  . Colesevelam HCl (WELCHOL) 3.75 G PACK USE 1 PACKET ONCE A DAY 90 packet 1  . citalopram (CELEXA) 20 MG tablet TAKE 1 TABLET (20 MG TOTAL) BY MOUTH DAILY. 90 tablet 1  . lisinopril (PRINIVIL,ZESTRIL) 40 MG tablet TAKE 1 TABLET EVERY DAY 90 tablet 1  . lisinopril (PRINIVIL,ZESTRIL) 40 MG tablet TAKE 1 TABLET EVERY DAY 90 tablet 1  . propranolol (INDERAL) 20 MG tablet TAKE 1 TABLET (20 MG TOTAL) BY MOUTH 2 (TWO) TIMES DAILY. 180 tablet 1   No  facility-administered medications prior to visit.     Past Medical History  Diagnosis Date  . Hypertension   . Hyperlipidemia   . Pre-diabetes   . Hemorrhoids   . Depression   . Unspecified vitamin D deficiency   . Obese      Past Surgical History  Procedure Laterality Date  . Colonoscopy  10/27/2011    Procedure: COLONOSCOPY;  Surgeon: Rachael Fee, MD;  Location: [facility];  Service: Endoscopy;  Laterality: N/A;  pt is above weight limit  . Wisdom tooth extraction       Family History  Problem Relation Age of Onset  . Hypertension Father   . Diabetes Father   . Arthritis Father   . Heart disease Mother   . Depression Mother   . Thyroid disease Mother   . Hyperlipidemia Brother   . Colon cancer Neg Hx      Social History   Social History  . Marital Status: Married    Spouse Name: N/A  . Number of Children: 2  . 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 Smoking status: Never Smoker   . Smokeless tobacco: Never Used  . Alcohol Use: No  . Drug Use: No  . Sexual Activity: Yes   Other Topics  Concern  . Not on file   Social History Narrative   Fun: Watch basketball   Denies religious beliefs effecting health care.     Review of Systems  Constitutional: Denies fever, chills, fatigue, or significant weight gain/loss. HENT: Head: Denies headache or neck pain Ears: Denies changes in hearing, ringing in ears, earache, drainage Nose: Denies discharge, stuffiness, itching, nosebleed, sinus pain Throat: Denies sore throat, hoarseness, dry mouth, sores, thrush Eyes: Denies loss/changes in vision, pain, redness, blurry/double vision, flashing lights Cardiovascular: Denies chest pain/discomfort, tightness, palpitations, shortness of breath with activity, difficulty lying down, swelling, sudden awakening with shortness of breath Respiratory: Denies shortness of breath, cough, sputum production,  wheezing Gastrointestinal: Denies dysphasia, change in appetite, nausea, change in bowel habits, rectal bleeding, constipation, diarrhea, yellow skin or eyes Reflux and occasional bloating Genitourinary: Denies frequency, urgency, burning/pain, blood in urine, incontinence, change in urinary strength. Musculoskeletal: Denies muscle/joint pain, stiffness, back pain, redness or swelling of joints, trauma Skin: Denies rashes, lumps, itching, dryness, color changes, or hair/nail changes Neurological: Denies dizziness, fainting, seizures, weakness, numbness, tingling, tremor Psychiatric - Denies nervousness, stress, depression or memory loss Endocrine: Denies heat or cold intolerance, sweating, frequent urination, excessive thirst, changes in appetite Hematologic: Denies ease of bruising or bleeding     Objective:    BP 138/80 mmHg  Pulse 82  Temp(Src) 98.6 F (37 C) (Oral)  Resp 18  Ht  (1.93 m)  Wt 356 lb (161.481 kg)  BMI 43.35 kg/m2  SpO2 98% Nursing note and vital signs reviewed.  Physical Exam  Constitutional: He is oriented to person, place, and time. He appears well-developed and well-nourished.  HENT:  Head: Normocephalic.  Right Ear: Hearing, tympanic membrane, external ear and ear canal normal.  Left Ear: Hearing, tympanic membrane, external ear and ear canal normal.  Nose: Nose normal.  Mouth/Throat: Uvula is midline, oropharynx is clear and moist and mucous membranes are normal.  Eyes: Conjunctivae and EOM are normal. Pupils are equal, round, and reactive to light.  Neck: Neck supple. No JVD present. No tracheal deviation present. No thyromegaly present.  Cardiovascular: Normal rate, regular rhythm, normal heart sounds and intact distal pulses.   Pulmonary/Chest: Effort normal and breath sounds normal.  Abdominal: Soft. Bowel sounds are normal. He exhibits no distension and no mass. There is no tenderness. There is no rebound and no guarding.  Musculoskeletal:  Normal range of motion. He exhibits no edema or tenderness.  Lymphadenopathy:    He has no cervical adenopathy.  Neurological: He is alert and oriented to person, place, and time. He has normal reflexes. No cranial nerve deficit. He exhibits normal muscle tone. Coordination normal.  Skin: Skin is warm and dry.  Psychiatric: He has a normal mood and affect. His behavior is normal. Judgment and thought content normal.       Assessment & Plan:   Problem List Items Addressed This Visit      Other   Routine general medical examination at a health care facility - Primary    1) Anticipatory Guidance: Discussed importance of wearing a seatbelt while driving and not texting while driving; changing batteries in smoke detector at least once annually; wearing suntan lotion when outside; eating a balanced and moderate diet; getting physical activity at least 30 minutes per day.  2) Immunizations / Screenings / Labs:  Declined flu shot. All other immunizations are up to date per recommendations. Due for a vision screen which will be scheduled independently. All other screenings are  up to date per recommendations. Obtain CBC, BMET, A1c, Lipid profile, Vitamin D,  and TSH.   Overall well exam. Risk factors for cardiovascular disease include hypertension, hyperlipidemia, obesity and sedentary lifestyle. Hypertension is currently adequately controlled with blood pressure below goal of 140/90. Continue current dosage of lisinopril. Hyperlipidemia still remains elevated with Welchol and notes intolerance to statins. Obesity and sedentary risk factors can be adjusted and improved with nutrient dense foods, decreased processed and sugary foods and increased physical activity. Recommend goal weight loss of 5-10% of current body weight. Discussed the importance of nutrition and exercise in reducing risk factors. Continue other healthy lifestyle behaviors and follow up prevention exam in 1 year. Follow up office visit  pending lab work.       Relevant Orders   CBC   Comprehensive metabolic panel   Lipid panel   TSH   Hemoglobin A1c   Vitamin D 1,25 dihydroxy

## 2015-01-03 LAB — VITAMIN D 1,25 DIHYDROXY
VITAMIN D 1, 25 (OH) TOTAL: 21 pg/mL (ref 18–72)
VITAMIN D3 1, 25 (OH): 21 pg/mL

## 2015-01-05 ENCOUNTER — Encounter: Payer: Self-pay | Admitting: Family

## 2015-01-29 ENCOUNTER — Encounter: Payer: Self-pay | Admitting: Emergency Medicine

## 2015-06-09 ENCOUNTER — Other Ambulatory Visit: Payer: Self-pay | Admitting: Emergency Medicine

## 2015-06-17 ENCOUNTER — Other Ambulatory Visit: Payer: Self-pay | Admitting: Emergency Medicine

## 2015-06-18 ENCOUNTER — Encounter: Payer: Self-pay | Admitting: Family

## 2015-06-18 ENCOUNTER — Other Ambulatory Visit: Payer: Self-pay

## 2015-06-18 MED ORDER — CITALOPRAM HYDROBROMIDE 20 MG PO TABS: 20.0000 mg | ORAL_TABLET | Freq: Every day | ORAL | Status: DC

## 2015-07-10 ENCOUNTER — Encounter: Payer: Self-pay | Admitting: Family

## 2015-07-10 MED ORDER — PROPRANOLOL HCL 20 MG PO TABS: ORAL_TABLET | ORAL | Status: DC

## 2015-07-13 ENCOUNTER — Telehealth: Payer: Self-pay | Admitting: Gastroenterology

## 2015-07-13 NOTE — Telephone Encounter (Signed)
The pt has been put on the wait list for a sooner appt and was given instructions to use prep H, tucks pads, and sitz baths several time daily.  He was also notified to keep bowels soft and increase fiber and water.  The pt agreed and will call back if these recommendations do not help.

## 2015-08-05 ENCOUNTER — Encounter: Payer: Self-pay | Admitting: Family

## 2015-08-05 MED ORDER — COLESEVELAM HCL 3.75 G PO PACK: PACK | ORAL | Status: DC

## 2015-09-04 ENCOUNTER — Encounter: Payer: Self-pay | Admitting: Gastroenterology

## 2015-09-11 ENCOUNTER — Ambulatory Visit: Payer: Commercial Managed Care - HMO | Admitting: Gastroenterology

## 2015-09-17 ENCOUNTER — Other Ambulatory Visit: Payer: Self-pay | Admitting: Family

## 2015-10-01 ENCOUNTER — Other Ambulatory Visit: Payer: Self-pay | Admitting: Emergency Medicine

## 2015-10-09 ENCOUNTER — Other Ambulatory Visit: Payer: Self-pay

## 2015-10-09 ENCOUNTER — Encounter: Payer: Self-pay | Admitting: Family

## 2015-10-09 MED ORDER — LISINOPRIL 40 MG PO TABS: 40.0000 mg | ORAL_TABLET | Freq: Every day | ORAL | Status: DC

## 2015-11-10 ENCOUNTER — Other Ambulatory Visit: Payer: Self-pay | Admitting: Family

## 2015-11-10 MED ORDER — LISINOPRIL 40 MG PO TABS: 40.0000 mg | ORAL_TABLET | Freq: Every day | ORAL | 0 refills | Status: DC

## 2015-11-11 ENCOUNTER — Ambulatory Visit (INDEPENDENT_AMBULATORY_CARE_PROVIDER_SITE_OTHER): Payer: Commercial Managed Care - HMO | Admitting: Gastroenterology

## 2015-11-11 ENCOUNTER — Encounter: Payer: Self-pay | Admitting: Gastroenterology

## 2015-11-11 VITALS — BP 120/70 | HR 80 | Ht 76.0 in | Wt 366.0 lb

## 2015-11-11 DIAGNOSIS — K648 Other hemorrhoids: Secondary | ICD-10-CM | POA: Diagnosis not present

## 2015-11-11 DIAGNOSIS — K644 Residual hemorrhoidal skin tags: Secondary | ICD-10-CM

## 2015-11-11 NOTE — Patient Instructions (Signed)
Please start taking citrucel (orange flavored) powder fiber supplement.  This may cause some bloating at first but that usually goes away. Begin with a small spoonful and work your way up to a large, heaping spoonful daily over a week. Use baby wipe after initial cleaning after BM. Call in 5-6 weeks to report on your response.

## 2015-11-11 NOTE — Progress Notes (Signed)
Review of pertinent gastrointestinal problems: 1. Anal fissure; 2013 anal fissure noted on exam, presented with pain, bleeding.  Improved with sitz baths, fiber supplement.  Colonoscopy 10/2011 was normal.   HPI: This is a   very pleasant very pleasant 35 year old man whom I last saw about 5 years ago  Chief complaint is intermittent anal discomfort, lump at his backside  Lump at his anus.  Was very painful.  Went to urgent clinic and was given lidocaine cream.  The swelling improved but then came back a bit.  This is uncomfortable daily.  Has BM daily, has to push and strain and this will often flare.  Tried fiber supplement in past but not in a very long time.  No fevers or chills  He has actually gained about 6-8 pounds since he was last here several years ago  Review of systems: Pertinent positive and negative review of systems were noted in the above HPI section. Complete review of systems was performed and was otherwise normal.   Past Medical History:  Diagnosis Date  . Depression   . Hemorrhoids   . Hyperlipidemia   . Hypertension   . Obese   . Pre-diabetes   . Unspecified vitamin D deficiency     Past Surgical History:  Procedure Laterality Date  . COLONOSCOPY  10/27/2011   Procedure: COLONOSCOPY;  Surgeon: Rachael Fee, MD;  Location: [facility];  Service: Endoscopy;  Laterality: N/A;  pt is above weight limit  . WISDOM TOOTH EXTRACTION      Current Outpatient Prescriptions  Medication Sig Dispense Refill  . albuterol (PROVENTIL HFA;VENTOLIN HFA) 108 (90 BASE) MCG/ACT inhaler Inhale 1-2 puffs into the lungs every 6 (six) hours as needed for wheezing or shortness of breath. 18 g 3  . Cholecalciferol (VITAMIN D3) 5000 UNITS TABS Take 5,000 Units by mouth daily.    . citalopram (CELEXA) 20 MG tablet TAKE 1 TABLET BY MOUTH EVERY DAY 90 tablet 0  . Colesevelam HCl (WELCHOL) 3.75 g PACK USE 1 PACKET ONCE A DAY 90 packet 1  . lisinopril (PRINIVIL,ZESTRIL) 40 MG  tablet Take 1 tablet (40 mg total) by mouth daily. 90 tablet 0  . lisinopril (PRINIVIL,ZESTRIL) 40 MG tablet Take 1 tablet (40 mg total) by mouth daily. Yearly physical due in Sept must see MD for refills 90 tablet 0  . propranolol (INDERAL) 20 MG tablet TAKE 1 TABLET (20 MG TOTAL) BY MOUTH 2 (TWO) TIMES DAILY. 180 tablet 1   No current facility-administered medications for this visit.     Allergies as of 11/11/2015 - Review Complete 11/11/2015  Allergen Reaction Noted  . Statins Other (See Comments) 05/25/2011  . Latex Itching and Rash 02/24/2013    Family History  Problem Relation Age of Onset  . Hypertension Father   . Diabetes Father   . Arthritis Father   . Heart disease Mother   . Depression Mother   . Thyroid disease Mother   . Hyperlipidemia Brother   . Colon cancer Neg Hx   . Esophageal cancer Neg Hx   . Stomach cancer Neg Hx   . Pancreatic cancer Neg Hx   . Liver disease Neg Hx     Social History   Social History  . Marital status: Married    Spouse name: N/A  . Number of children: 2  . 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 Smoking status: Never Smoker  .  Smokeless tobacco: Never Used  . Alcohol use Yes     Comment: occasional  . Drug use: No  . Sexual activity: Yes   Other Topics Concern  . Not on file   Social History Narrative   Fun: Watch basketball   Denies religious beliefs effecting health care.      Physical Exam: BP 120/70   Pulse 80   Ht 6\' 4"  (1.93 m)   Wt (!) 366 lb (166 kg)   BMI 44.55 kg/m  Constitutional: generally well-appearing Psychiatric: alert and oriented x3 Eyes: extraocular movements intact Mouth: oral pharynx moist, no lesions Neck: supple no lymphadenopathy Cardiovascular: heart regular rate and rhythm Lungs: clear to auscultation bilaterally Abdomen: soft, nontender, nondistended, no obvious ascites, no peritoneal signs, normal bowel  sounds Extremities: no lower extremity edema bilaterally Skin: no lesions on visible extremities Rectal examination: No external anal hemorrhoids, no distal rectal masses, stool is brown and Hemoccult negative  Assessment and plan: 35 y.o. male with  intermittent anal discomfort, lump; very likely intermittent hemorrhoids  He has to push and strain to move his bowels on a periodic basis and says this is when he will often flare with his discomforts. I recommended daily fiber supplements. He has tried these in the past and has noticed a help him move his bowels easier. He will try these again now. I also recommended he use baby wipes after initial cleaning with toilet paper. He will call to report on his response in 5-6 weeks and sooner if needed.   Rob Buntinganiel Nomar Broad, MD  Gastroenterology 11/11/2015, 1:53 PM  Cc: Veryl Speakalone, Gregory D, FNP

## 2015-11-25 ENCOUNTER — Ambulatory Visit: Payer: Commercial Managed Care - HMO | Admitting: Family

## 2015-11-27 ENCOUNTER — Encounter: Payer: Self-pay | Admitting: Family

## 2015-11-27 ENCOUNTER — Encounter: Payer: Commercial Managed Care - HMO | Admitting: Family

## 2015-11-27 MED ORDER — CITALOPRAM HYDROBROMIDE 20 MG PO TABS: 20.0000 mg | ORAL_TABLET | Freq: Every day | ORAL | 0 refills | Status: DC

## 2015-11-27 NOTE — Progress Notes (Signed)
Error

## 2015-12-30 ENCOUNTER — Ambulatory Visit (INDEPENDENT_AMBULATORY_CARE_PROVIDER_SITE_OTHER): Payer: Commercial Managed Care - HMO | Admitting: Family

## 2015-12-30 ENCOUNTER — Other Ambulatory Visit (INDEPENDENT_AMBULATORY_CARE_PROVIDER_SITE_OTHER): Payer: Commercial Managed Care - HMO

## 2015-12-30 ENCOUNTER — Encounter: Payer: Self-pay | Admitting: Family

## 2015-12-30 VITALS — BP 128/70 | HR 87 | Temp 98.1°F | Resp 18 | Ht 76.0 in | Wt 364.0 lb

## 2015-12-30 DIAGNOSIS — E559 Vitamin D deficiency, unspecified: Secondary | ICD-10-CM

## 2015-12-30 DIAGNOSIS — E669 Obesity, unspecified: Secondary | ICD-10-CM | POA: Diagnosis not present

## 2015-12-30 DIAGNOSIS — I10 Essential (primary) hypertension: Secondary | ICD-10-CM | POA: Diagnosis not present

## 2015-12-30 DIAGNOSIS — Z0000 Encounter for general adult medical examination without abnormal findings: Secondary | ICD-10-CM

## 2015-12-30 DIAGNOSIS — E785 Hyperlipidemia, unspecified: Secondary | ICD-10-CM

## 2015-12-30 LAB — LIPID PANEL
Cholesterol: 204 mg/dL — ABNORMAL HIGH (ref 0–200)
HDL: 33.3 mg/dL — ABNORMAL LOW (ref >39.00)
LDL CALC: 157 mg/dL — AB (ref 0–99)
NONHDL: 171.03
Total CHOL/HDL Ratio: 6
Triglycerides: 72 mg/dL (ref 0.0–149.0)
VLDL: 14.4 mg/dL (ref 0.0–40.0)

## 2015-12-30 LAB — CBC
HCT: 45.6 % (ref 39.0–52.0)
Hemoglobin: 15.6 g/dL (ref 13.0–17.0)
MCHC: 34.2 g/dL (ref 30.0–36.0)
MCV: 87.1 fl (ref 78.0–100.0)
Platelets: 157 10*3/uL (ref 150.0–400.0)
RBC: 5.24 Mil/uL (ref 4.22–5.81)
RDW: 12.4 % (ref 11.5–15.5)
WBC: 7.3 10*3/uL (ref 4.0–10.5)

## 2015-12-30 LAB — COMPREHENSIVE METABOLIC PANEL
ALK PHOS: 102 U/L (ref 39–117)
ALT: 24 U/L (ref 0–53)
AST: 20 U/L (ref 0–37)
Albumin: 3.7 g/dL (ref 3.5–5.2)
BUN: 17 mg/dL (ref 6–23)
CO2: 27 mEq/L (ref 19–32)
Calcium: 8.8 mg/dL (ref 8.4–10.5)
Chloride: 106 mEq/L (ref 96–112)
Creatinine, Ser: 1.14 mg/dL (ref 0.40–1.50)
GFR: 93.41 mL/min (ref >60.00)
GLUCOSE: 91 mg/dL (ref 70–99)
POTASSIUM: 4.2 meq/L (ref 3.5–5.1)
SODIUM: 139 meq/L (ref 135–145)
TOTAL PROTEIN: 7.2 g/dL (ref 6.0–8.3)
Total Bilirubin: 0.5 mg/dL (ref 0.2–1.2)

## 2015-12-30 LAB — HEMOGLOBIN A1C: HEMOGLOBIN A1C: 5.6 % (ref 4.6–6.5)

## 2015-12-30 LAB — VITAMIN D 25 HYDROXY (VIT D DEFICIENCY, FRACTURES): VITD: 27.65 ng/mL — ABNORMAL LOW (ref 30.00–100.00)

## 2015-12-30 NOTE — Progress Notes (Signed)
Subjective:    Patient ID: Frank Shaffer, male    DOB: 17-Nov-1980, 35 y.o.   MRN: [id number]  Chief Complaint  Patient presents with  . CPE    fasting    HPI:  Frank Shaffer is a 35 y.o. male who presents today for an annual wellness visit.   1) Health Maintenance -   Diet - Averages about 2 meals per day consisting of a regular diet. Caffeine intake of about 1-2 cups per day; Frequent fast/processed foods.   Exercise - Not much right now.    2) Preventative Exams / Immunizations:  Dental  - Due for exam  Vision -- Due for exam.    Health Maintenance  Topic Date Due  . HIV Screening  11/14/1994  . INFLUENZA VACCINE  07/02/2016 (Originally 11/03/2015)  . TETANUS/TDAP  05/10/2021    Immunization History  Administered Date(s) Administered  . PPD Test 01/21/2014  . Tdap 05/11/2011     Allergies  Allergen Reactions  . Statins Other (See Comments)    Myalgias and weakness  . Latex Itching and Rash     Outpatient Medications Prior to Visit  Medication Sig Dispense Refill  . albuterol (PROVENTIL HFA;VENTOLIN HFA) 108 (90 BASE) MCG/ACT inhaler Inhale 1-2 puffs into the lungs every 6 (six) hours as needed for wheezing or shortness of breath. 18 g 3  . Cholecalciferol (VITAMIN D3) 5000 UNITS TABS Take 5,000 Units by mouth daily.    . citalopram (CELEXA) 20 MG tablet Take 1 tablet (20 mg total) by mouth daily. 30 tablet 0  . Colesevelam HCl (WELCHOL) 3.75 g PACK USE 1 PACKET ONCE A DAY 90 packet 1  . lisinopril (PRINIVIL,ZESTRIL) 40 MG tablet Take 1 tablet (40 mg total) by mouth daily. Yearly physical due in Sept must see MD for refills 90 tablet 0  . propranolol (INDERAL) 20 MG tablet TAKE 1 TABLET (20 MG TOTAL) BY MOUTH 2 (TWO) TIMES DAILY. 180 tablet 1   No facility-administered medications prior to visit.      Past Medical History:  Diagnosis Date  . Depression   . Hemorrhoids   . Hyperlipidemia   . Hypertension   . Obese   . Pre-diabetes   .  Unspecified vitamin D deficiency      Past Surgical History:  Procedure Laterality Date  . COLONOSCOPY  10/27/2011   Procedure: COLONOSCOPY;  Surgeon: Rachael Feeaniel P Jacobs, MD;  Location: [facility];  Service: Endoscopy;  Laterality: N/A;  pt is above weight limit  . WISDOM TOOTH EXTRACTION       Family History  Problem Relation Age of Onset  . Hypertension Father   . Diabetes Father   . Arthritis Father   . Heart disease Mother   . Depression Mother   . Thyroid disease Mother   . Hyperlipidemia Brother   . Colon cancer Neg Hx   . Esophageal cancer Neg Hx   . Stomach cancer Neg Hx   . Pancreatic cancer Neg Hx   . Liver disease Neg Hx      Social History   Social History  . Marital status: Married    Spouse name: N/A  . Number of children: 2  . Years of education: 12   Occupational History  . Solid Research scientist (physical sciences)Waste Operator    Social History Main Topics  . Smoking status: Never Smoker  . Smokeless tobacco: Never Used  . Alcohol use Yes     Comment: occasional  . Drug use: No  .  Sexual activity: Yes   Other Topics Concern  . Not on file   Social History Narrative   Fun: Watch basketball   Denies religious beliefs effecting health care.      Review of Systems  Constitutional: Denies fever, chills, fatigue, or significant weight gain/loss. HENT: Head: Denies headache or neck pain Ears: Denies changes in hearing, ringing in ears, earache, drainage Nose: Denies discharge, stuffiness, itching, nosebleed, sinus pain Throat: Denies sore throat, hoarseness, dry mouth, sores, thrush Eyes: Denies loss/changes in vision, pain, redness, blurry/double vision, flashing lights Cardiovascular: Denies chest pain/discomfort, tightness, palpitations, shortness of breath with activity, difficulty lying down, swelling, sudden awakening with shortness of breath Respiratory: Denies shortness of breath, cough, sputum production, wheezing Gastrointestinal: Denies dysphasia, heartburn, change  in appetite, nausea, change in bowel habits, rectal bleeding, constipation, diarrhea, yellow skin or eyes Genitourinary: Denies frequency, urgency, burning/pain, blood in urine, incontinence, change in urinary strength. Musculoskeletal: Denies muscle/joint pain, stiffness, back pain, redness or swelling of joints, trauma Skin: Denies rashes, lumps, itching, dryness, color changes, or hair/nail changes Neurological: Denies dizziness, fainting, seizures, weakness, numbness, tingling, tremor Psychiatric - Denies nervousness, stress, depression or memory loss Endocrine: Denies heat or cold intolerance, sweating, frequent urination, excessive thirst, changes in appetite Hematologic: Denies ease of bruising or bleeding     Objective:     BP 128/70 (BP Location: Left Arm, Patient Position: Sitting, Cuff Size: Large)   Pulse 87   Temp 98.1 F (36.7 C) (Oral)   Resp 18   Ht 6\' 4"  (1.93 m)   Wt (!) 364 lb (165.1 kg)   SpO2 97%   BMI 44.31 kg/m  Nursing note and vital signs reviewed.  Physical Exam  Constitutional: He is oriented to person, place, and time. He appears well-developed and well-nourished.  HENT:  Head: Normocephalic.  Right Ear: Hearing, tympanic membrane, external ear and ear canal normal.  Left Ear: Hearing, tympanic membrane, external ear and ear canal normal.  Nose: Nose normal.  Mouth/Throat: Uvula is midline, oropharynx is clear and moist and mucous membranes are normal.  Eyes: Conjunctivae and EOM are normal. Pupils are equal, round, and reactive to light.  Neck: Neck supple. No JVD present. No tracheal deviation present. No thyromegaly present.  Cardiovascular: Normal rate, regular rhythm, normal heart sounds and intact distal pulses.   Pulmonary/Chest: Effort normal and breath sounds normal.  Abdominal: Soft. Bowel sounds are normal. He exhibits no distension and no mass. There is no tenderness. There is no rebound and no guarding.  Musculoskeletal: Normal range of  motion. He exhibits no edema or tenderness.  Lymphadenopathy:    He has no cervical adenopathy.  Neurological: He is alert and oriented to person, place, and time. He has normal reflexes. No cranial nerve deficit. He exhibits normal muscle tone. Coordination normal.  Skin: Skin is warm and dry.  Psychiatric: He has a normal mood and affect. His behavior is normal. Judgment and thought content normal.       Assessment & Plan:   Problem List Items Addressed This Visit      Cardiovascular and Mediastinum   Hypertension    Blood pressure appears well controlled and no adverse side effects or symptoms of end organ damage. Continue current dosage of lisinopril and propranolol. Monitor blood pressure at home. Continue to work on low sodium diet.        Other   Vitamin D deficiency    Not currently taking vitamin D. Obtain vitamin D levels. Restart medications if  indicated by blood work.      Hyperlipidemia    Cholesterol appears stable. Continue current dosage of WelChol pending lipid profile results.      Obese    BMI of 44. Recommend weight loss of 5-10% of current body weight. Recommend increasing physical activity to 30 minutes of moderate level activity daily. Encourage nutritional intake that focuses on nutrient dense foods and is moderate, varied, and balanced and is low in saturated fats and processed/sugary foods. Continue to monitor.         Routine general medical examination at a health care facility - Primary    1) Anticipatory Guidance: Discussed importance of wearing a seatbelt while driving and not texting while driving; changing batteries in smoke detector at least once annually; wearing suntan lotion when outside; eating a balanced and moderate diet; getting physical activity at least 30 minutes per day.  2) Immunizations / Screenings / Labs:  Declines influenza. All other immunizations are up-to-date per recommendations. Obtain vitamin D for vitamin D deficiency  screening. Due for a dental and vision exam encouraged to be completed independently. Obtain CBC, CMET, and lipid profile.    Overall well exam with risk factors for cardiovascular disease including hypertension, hyperlipidemia, and obesity. Hypertension appears adequately controlled current medication regimen. Hyperlipidemia managed with WelChol. Recommend weight loss of 5-10% of current body weight through nutrition and physical activity. Goal is to increase physical activity to 30 minutes of moderate level activity daily. Continue other healthy lifestyle behaviors and choices. I'll a prevention exam in 1 year. Follow-up office visit pending blood work as needed.      Relevant Orders   CBC (Completed)   Comprehensive metabolic panel (Completed)   Lipid panel (Completed)   Hemoglobin A1c (Completed)   VITAMIN D 25 Hydroxy (Vit-D Deficiency, Fractures)    Other Visit Diagnoses   None.      I am having Mr. Vanpatten maintain his Vitamin D3, albuterol, propranolol, Colesevelam HCl, lisinopril, and citalopram.   Follow-up: Return if symptoms worsen or fail to improve.   Jeanine Luz, FNP

## 2015-12-30 NOTE — Assessment & Plan Note (Signed)
BMI of 44. Recommend weight loss of 5-10% of current body weight. Recommend increasing physical activity to 30 minutes of moderate level activity daily. Encourage nutritional intake that focuses on nutrient dense foods and is moderate, varied, and balanced and is low in saturated fats and processed/sugary foods. Continue to monitor.   

## 2015-12-30 NOTE — Assessment & Plan Note (Signed)
Not currently taking vitamin D. Obtain vitamin D levels. Restart medications if indicated by blood work.

## 2015-12-30 NOTE — Assessment & Plan Note (Signed)
Blood pressure appears well controlled and no adverse side effects or symptoms of end organ damage. Continue current dosage of lisinopril and propranolol. Monitor blood pressure at home. Continue to work on low sodium diet.

## 2015-12-30 NOTE — Assessment & Plan Note (Signed)
1) Anticipatory Guidance: Discussed importance of wearing a seatbelt while driving and not texting while driving; changing batteries in smoke detector at least once annually; wearing suntan lotion when outside; eating a balanced and moderate diet; getting physical activity at least 30 minutes per day.  2) Immunizations / Screenings / Labs:  Declines influenza. All other immunizations are up-to-date per recommendations. Obtain vitamin D for vitamin D deficiency screening. Due for a dental and vision exam encouraged to be completed independently. Obtain CBC, CMET, and lipid profile.    Overall well exam with risk factors for cardiovascular disease including hypertension, hyperlipidemia, and obesity. Hypertension appears adequately controlled current medication regimen. Hyperlipidemia managed with WelChol. Recommend weight loss of 5-10% of current body weight through nutrition and physical activity. Goal is to increase physical activity to 30 minutes of moderate level activity daily. Continue other healthy lifestyle behaviors and choices. I'll a prevention exam in 1 year. Follow-up office visit pending blood work as needed.

## 2015-12-30 NOTE — Patient Instructions (Addendum)
Thank you for choosing [facility].  SUMMARY AND INSTRUCTIONS:  Medication:  Continue to take your medications prescribed.   Your prescription(s) have been submitted to your pharmacy or been printed and provided for you. Please take as directed and contact our office if you believe you are having problem(s) with the medication(s) or have any questions.  Labs:  Please stop by the lab on the lower level of the building for your blood work. Your results will be released to MyChart (or called to you) after review, usually within 72 hours after test completion. If any changes need to be made, you will be notified at that same time.  1.) The lab is open from 7:30am to 5:30 pm Monday-Friday 2.) No appointment is necessary 3.) Fasting (if needed) is 6-8 hours after food and drink; black coffee and water are okay   Follow up:  If your symptoms worsen or fail to improve, please contact our office for further instruction, or in case of emergency go directly to the emergency room at the closest medical facility.   Health Maintenance, Male A healthy lifestyle and preventative care can promote health and wellness.  Maintain regular health, dental, and eye exams.  Eat a healthy diet. Foods like vegetables, fruits, whole grains, low-fat dairy products, and lean protein foods contain the nutrients you need and are low in calories. Decrease your intake of foods high in solid fats, added sugars, and salt. Get information about a proper diet from your health care provider, if necessary.  Regular physical exercise is one of the most important things you can do for your health. Most adults should get at least 150 minutes of moderate-intensity exercise (any activity that increases your heart rate and causes you to sweat) each week. In addition, most adults need muscle-strengthening exercises on 2 or more days a week.   Maintain a healthy weight. The body mass index (BMI) is a screening tool to  identify possible weight problems. It provides an estimate of body fat based on height and weight. Your health care provider can find your BMI and can help you achieve or maintain a healthy weight. For males 20 years and older:  A BMI below 18.5 is considered underweight.  A BMI of 18.5 to 24.9 is normal.  A BMI of 25 to 29.9 is considered overweight.  A BMI of 30 and above is considered obese.  Maintain normal blood lipids and cholesterol by exercising and minimizing your intake of saturated fat. Eat a balanced diet with plenty of fruits and vegetables. Blood tests for lipids and cholesterol should begin at age 74 and be repeated every 5 years. If your lipid or cholesterol levels are high, you are over age 44, or you are at high risk for heart disease, you may need your cholesterol levels checked more frequently.Ongoing high lipid and cholesterol levels should be treated with medicines if diet and exercise are not working.  If you smoke, find out from your health care provider how to quit. If you do not use tobacco, do not start.  Lung cancer screening is recommended for adults aged 55-80 years who are at high risk for developing lung cancer because of a history of smoking. A yearly low-dose CT scan of the lungs is recommended for people who have at least a 30-pack-year history of smoking and are current smokers or have quit within the past 15 years. A pack year of smoking is smoking an average of 1 pack of cigarettes a day for  1 year (for example, a 30-pack-year history of smoking could mean smoking 1 pack a day for 30 years or 2 packs a day for 15 years). Yearly screening should continue until the smoker has stopped smoking for at least 15 years. Yearly screening should be stopped for people who develop a health problem that would prevent them from having lung cancer treatment.  If you choose to drink alcohol, do not have more than 2 drinks per day. One drink is considered to be 12 oz (360 mL) of  beer, 5 oz (150 mL) of wine, or 1.5 oz (45 mL) of liquor.  Avoid the use of street drugs. Do not share needles with anyone. Ask for help if you need support or instructions about stopping the use of drugs.  High blood pressure causes heart disease and increases the risk of stroke. High blood pressure is more likely to develop in:  People who have blood pressure in the end of the normal range (100-139/85-89 mm Hg).  People who are overweight or obese.  People who are African American.  If you are 11-75 years of age, have your blood pressure checked every 3-5 years. If you are 25 years of age or older, have your blood pressure checked every year. You should have your blood pressure measured twice--once when you are at a hospital or clinic, and once when you are not at a hospital or clinic. Record the average of the two measurements. To check your blood pressure when you are not at a hospital or clinic, you can use:  An automated blood pressure machine at a pharmacy.  A home blood pressure monitor.  If you are 97-88 years old, ask your health care provider if you should take aspirin to prevent heart disease.  Diabetes screening involves taking a blood sample to check your fasting blood sugar level. This should be done once every 3 years after age 56 if you are at a normal weight and without risk factors for diabetes. Testing should be considered at a younger age or be carried out more frequently if you are overweight and have at least 1 risk factor for diabetes.  Colorectal cancer can be detected and often prevented. Most routine colorectal cancer screening begins at the age of 27 and continues through age 15. However, your health care provider may recommend screening at an earlier age if you have risk factors for colon cancer. On a yearly basis, your health care provider may provide home test kits to check for hidden blood in the stool. A small camera at the end of a tube may be used to directly  examine the colon (sigmoidoscopy or colonoscopy) to detect the earliest forms of colorectal cancer. Talk to your health care provider about this at age 79 when routine screening begins. A direct exam of the colon should be repeated every 5-10 years through age 83, unless early forms of precancerous polyps or small growths are found.  People who are at an increased risk for hepatitis B should be screened for this virus. You are considered at high risk for hepatitis B if:  You were born in a country where hepatitis B occurs often. Talk with your health care provider about which countries are considered high risk.  Your parents were born in a high-risk country and you have not received a shot to protect against hepatitis B (hepatitis B vaccine).  You have HIV or AIDS.  You use needles to inject street drugs.  You live with, or  have sex with, someone who has hepatitis B.  You are a man who has sex with other men (MSM).  You get hemodialysis treatment.  You take certain medicines for conditions like cancer, organ transplantation, and autoimmune conditions.  Hepatitis C blood testing is recommended for all people born from [id number] through 1965 and any individual with known risk factors for hepatitis C.  Healthy men should no longer receive prostate-specific antigen (PSA) blood tests as part of routine cancer screening. Talk to your health care provider about prostate cancer screening.  Testicular cancer screening is not recommended for adolescents or adult males who have no symptoms. Screening includes self-exam, a health care provider exam, and other screening tests. Consult with your health care provider about any symptoms you have or any concerns you have about testicular cancer.  Practice safe sex. Use condoms and avoid high-risk sexual practices to reduce the spread of sexually transmitted infections (STIs).  You should be screened for STIs, including gonorrhea and chlamydia if:  You are  sexually active and are younger than 24 years.  You are older than 24 years, and your health care provider tells you that you are at risk for this type of infection.  Your sexual activity has changed since you were last screened, and you are at an increased risk for chlamydia or gonorrhea. Ask your health care provider if you are at risk.  If you are at risk of being infected with HIV, it is recommended that you take a prescription medicine daily to prevent HIV infection. This is called pre-exposure prophylaxis (PrEP). You are considered at risk if:  You are a man who has sex with other men (MSM).  You are a heterosexual man who is sexually active with multiple partners.  You take drugs by injection.  You are sexually active with a partner who has HIV.  Talk with your health care provider about whether you are at high risk of being infected with HIV. If you choose to begin PrEP, you should first be tested for HIV. You should then be tested every 3 months for as long as you are taking PrEP.  Use sunscreen. Apply sunscreen liberally and repeatedly throughout the day. You should seek shade when your shadow is shorter than you. Protect yourself by wearing long sleeves, pants, a wide-brimmed hat, and sunglasses year round whenever you are outdoors.  Tell your health care provider of new moles or changes in moles, especially if there is a change in shape or color. Also, tell your health care provider if a mole is larger than the size of a pencil eraser.  A one-time screening for abdominal aortic aneurysm (AAA) and surgical repair of large AAAs by ultrasound is recommended for men aged 65-75 years who are current or former smokers.  Stay current with your vaccines (immunizations).   This information is not intended to replace advice given to you by your health care provider. Make sure you discuss any questions you have with your health care provider.   Document Released: 09/17/2007 Document  Revised: 04/11/2014 Document Reviewed: 08/16/2010 Elsevier Interactive Patient Education [employer] Elsevier Inc.

## 2015-12-30 NOTE — Assessment & Plan Note (Signed)
Cholesterol appears stable. Continue current dosage of WelChol pending lipid profile results.

## 2015-12-31 ENCOUNTER — Encounter: Payer: Self-pay | Admitting: Family

## 2016-01-04 ENCOUNTER — Other Ambulatory Visit: Payer: Self-pay | Admitting: Family

## 2016-01-21 ENCOUNTER — Other Ambulatory Visit: Payer: Self-pay | Admitting: Family

## 2016-01-23 ENCOUNTER — Other Ambulatory Visit: Payer: Self-pay | Admitting: Family

## 2016-02-08 ENCOUNTER — Other Ambulatory Visit: Payer: Self-pay | Admitting: Family

## 2016-02-22 ENCOUNTER — Other Ambulatory Visit: Payer: Self-pay | Admitting: Family

## 2016-02-23 ENCOUNTER — Other Ambulatory Visit: Payer: Self-pay

## 2016-02-23 MED ORDER — CITALOPRAM HYDROBROMIDE 20 MG PO TABS: 20.0000 mg | ORAL_TABLET | Freq: Every day | ORAL | 0 refills | Status: DC

## 2016-04-30 ENCOUNTER — Other Ambulatory Visit: Payer: Self-pay | Admitting: Family

## 2016-05-10 ENCOUNTER — Other Ambulatory Visit: Payer: Self-pay | Admitting: Family

## 2016-05-10 MED ORDER — CITALOPRAM HYDROBROMIDE 20 MG PO TABS: 20.0000 mg | ORAL_TABLET | Freq: Every day | ORAL | 5 refills | Status: DC

## 2016-05-30 ENCOUNTER — Other Ambulatory Visit: Payer: Self-pay | Admitting: Family

## 2016-06-07 ENCOUNTER — Other Ambulatory Visit: Payer: Self-pay | Admitting: Family

## 2016-06-07 MED ORDER — LISINOPRIL 40 MG PO TABS: 40.0000 mg | ORAL_TABLET | Freq: Every day | ORAL | 2 refills | Status: DC

## 2016-06-27 ENCOUNTER — Ambulatory Visit (INDEPENDENT_AMBULATORY_CARE_PROVIDER_SITE_OTHER): Payer: Commercial Managed Care - HMO | Admitting: Podiatry

## 2016-06-27 ENCOUNTER — Encounter: Payer: Self-pay | Admitting: Podiatry

## 2016-06-27 DIAGNOSIS — L84 Corns and callosities: Secondary | ICD-10-CM | POA: Diagnosis not present

## 2016-06-27 DIAGNOSIS — M779 Enthesopathy, unspecified: Secondary | ICD-10-CM | POA: Diagnosis not present

## 2016-06-27 DIAGNOSIS — M2041 Other hammer toe(s) (acquired), right foot: Secondary | ICD-10-CM | POA: Diagnosis not present

## 2016-06-27 MED ORDER — TRIAMCINOLONE ACETONIDE 10 MG/ML IJ SUSP
10.0000 mg | Freq: Once | INTRAMUSCULAR | Status: AC
  Administered 2016-06-27: 10 mg

## 2016-06-29 NOTE — Progress Notes (Signed)
Subjective:     Patient ID: Frank Shaffer, male   DOB: 09/04/1980, 36 y.o.   MRN: [id number]  HPI patient presents with a lot of pain between the fourth and fifth toes of the right foot with keratotic lesion formation. States she has trouble wearing shoe gear and is tried wider shoes and padding the area without relief   Review of Systems  All other systems reviewed and are negative.      Objective:   Physical Exam  Constitutional: He is oriented to person, place, and time.  Cardiovascular: Intact distal pulses.   Musculoskeletal: Normal range of motion.  Neurological: He is oriented to person, place, and time.  Skin: Skin is warm.  Nursing note and vitals reviewed.  neurovascular status intact muscle strength adequate range of motion within normal limits with patient found to have inflammatory changes on the inside of the fifth digit outside of the fourth digit right that are painful when pressed and makes shoe gear difficult. There is keratotic tissue formation associated with this. Patient's found have good digital perfusion well oriented 3     Assessment:     Inflammatory capsulitis interphalangeal joint right along with keratotic lesion formation and rotated    Plan:     H&P and x-ray reviewed with patient. Today I did a careful interphalangeal joint injection 2 mg dexamethasone Kenalog 2 mill grams Xylocaine and debris did lesion applied padding and we will see back when symptomatic  X-ray report indicates that there is some rotation of the toe and compression between the fourth and fifth digit right

## 2016-09-03 ENCOUNTER — Other Ambulatory Visit: Payer: Self-pay | Admitting: Family

## 2016-09-07 ENCOUNTER — Other Ambulatory Visit: Payer: Self-pay | Admitting: Family

## 2016-11-02 ENCOUNTER — Other Ambulatory Visit: Payer: Self-pay | Admitting: Family

## 2016-12-01 ENCOUNTER — Other Ambulatory Visit: Payer: Self-pay | Admitting: Family

## 2017-01-03 ENCOUNTER — Other Ambulatory Visit: Payer: Self-pay | Admitting: Family

## 2017-01-04 ENCOUNTER — Other Ambulatory Visit: Payer: Self-pay | Admitting: Family

## 2017-01-09 ENCOUNTER — Other Ambulatory Visit: Payer: Self-pay | Admitting: Family

## 2017-01-10 ENCOUNTER — Encounter: Payer: Self-pay | Admitting: Family

## 2017-01-10 ENCOUNTER — Other Ambulatory Visit: Payer: Self-pay | Admitting: Family

## 2017-01-11 ENCOUNTER — Telehealth: Payer: Self-pay | Admitting: Internal Medicine

## 2017-01-11 ENCOUNTER — Telehealth: Payer: Self-pay | Admitting: Family

## 2017-01-11 MED ORDER — PROPRANOLOL HCL 20 MG PO TABS: 20.0000 mg | ORAL_TABLET | Freq: Two times a day (BID) | ORAL | 1 refills | Status: DC

## 2017-01-11 MED ORDER — CITALOPRAM HYDROBROMIDE 20 MG PO TABS: 20.0000 mg | ORAL_TABLET | Freq: Every day | ORAL | 1 refills | Status: DC

## 2017-01-11 NOTE — Telephone Encounter (Signed)
Notified pt/mom refill sent to [facility].../lmb

## 2017-01-11 NOTE — Telephone Encounter (Signed)
Ok for 1 month supply with 1 refill.

## 2017-01-11 NOTE — Telephone Encounter (Signed)
Pt is requesting a refill on citalopram (CELEXA) 20 MG tablet and propranolol (INDERAL) 20 MG tablet. He is due for an appointment for further refills and was scheduled to see Tammy Sours on Friday but is not able to get off work because they are short staffed. He did schedule an appointment with Alphonse Guild to establish care on Wednesday, October 17th. They wanted to know if a small supply could be sent over to get him through until he is able to come in to see her because he is completely out. Please advise.

## 2017-01-11 NOTE — Telephone Encounter (Signed)
Patient would like to know if he can transfer to [facility] after Tammy Sours leaves the office.  Patient states that his parents are currently patients of [facility].  Please advise.

## 2017-01-13 ENCOUNTER — Ambulatory Visit: Payer: Commercial Managed Care - HMO | Admitting: Family

## 2017-01-18 ENCOUNTER — Encounter: Payer: Self-pay | Admitting: Nurse Practitioner

## 2017-01-18 ENCOUNTER — Ambulatory Visit (INDEPENDENT_AMBULATORY_CARE_PROVIDER_SITE_OTHER): Payer: 59 | Admitting: Nurse Practitioner

## 2017-01-18 VITALS — BP 132/76 | HR 75 | Temp 98.5°F | Resp 16 | Ht 76.0 in | Wt 384.0 lb

## 2017-01-18 DIAGNOSIS — I10 Essential (primary) hypertension: Secondary | ICD-10-CM | POA: Diagnosis not present

## 2017-01-18 DIAGNOSIS — E559 Vitamin D deficiency, unspecified: Secondary | ICD-10-CM

## 2017-01-18 DIAGNOSIS — Z0000 Encounter for general adult medical examination without abnormal findings: Secondary | ICD-10-CM | POA: Diagnosis not present

## 2017-01-18 DIAGNOSIS — R7303 Prediabetes: Secondary | ICD-10-CM

## 2017-01-18 DIAGNOSIS — Z114 Encounter for screening for human immunodeficiency virus [HIV]: Secondary | ICD-10-CM

## 2017-01-18 NOTE — Assessment & Plan Note (Signed)
Maintained on lisinopril, propranolol. Will continue current dose. F/u in 6 months for re-check.

## 2017-01-18 NOTE — Assessment & Plan Note (Addendum)
Declines flu shot. HIV screening ordered. Discussed importance of eye exam for routine health maintenance and with history of HTN. He'll schedule an appointment with an opthamologist of his choice for eye exam. Discussed healthy diet and incorporating exercise into his daily routine. TSH, lipid profile, CBC, CMET, Vitamin D ordered, pt will return to lab when fasting for blood draw.

## 2017-01-18 NOTE — Patient Instructions (Addendum)
Please return for lab work when you are fasting.  Please schedule an appointment with an eye doctor for an annual eye exam as we discussed.  I'd like for you to add some exercise into your schedule. Start 10 minutes a day, a few days a week, and work up to 30 minutes a day at least 5 days a week.  It was nice to meet you. Thanks for letting me take care of you today :)   Health Maintenance, Male A healthy lifestyle and preventive care is important for your health and wellness. Ask your health care provider about what schedule of regular examinations is right for you. What should I know about weight and diet? Eat a Healthy Diet  Eat plenty of vegetables, fruits, whole grains, low-fat dairy products, and lean protein.  Do not eat a lot of foods high in solid fats, added sugars, or salt.  Maintain a Healthy Weight Regular exercise can help you achieve or maintain a healthy weight. You should:  Do at least 150 minutes of exercise each week. The exercise should increase your heart rate and make you sweat (moderate-intensity exercise).  Do strength-training exercises at least twice a week.  Watch Your Levels of Cholesterol and Blood Lipids  Have your blood tested for lipids and cholesterol every 5 years starting at 37 years of age. If you are at high risk for heart disease, you should start having your blood tested when you are 37 years old. You may need to have your cholesterol levels checked more often if: ? Your lipid or cholesterol levels are high. ? You are older than 37 years of age. ? You are at high risk for heart disease.  What should I know about cancer screening? Many types of cancers can be detected early and may often be prevented. Lung Cancer  You should be screened every year for lung cancer if: ? You are a current smoker who has smoked for at least 30 years. ? You are a former smoker who has quit within the past 15 years.  Talk to your health care provider about your  screening options, when you should start screening, and how often you should be screened.  Colorectal Cancer  Routine colorectal cancer screening usually begins at 37 years of age and should be repeated every 5-10 years until you are 37 years old. You may need to be screened more often if early forms of precancerous polyps or small growths are found. Your health care provider may recommend screening at an earlier age if you have risk factors for colon cancer.  Your health care provider may recommend using home test kits to check for hidden blood in the stool.  A small camera at the end of a tube can be used to examine your colon (sigmoidoscopy or colonoscopy). This checks for the earliest forms of colorectal cancer.  Prostate and Testicular Cancer  Depending on your age and overall health, your health care provider may do certain tests to screen for prostate and testicular cancer.  Talk to your health care provider about any symptoms or concerns you have about testicular or prostate cancer.  Skin Cancer  Check your skin from head to toe regularly.  Tell your health care provider about any new moles or changes in moles, especially if: ? There is a change in a mole's size, shape, or color. ? You have a mole that is larger than a pencil eraser.  Always use sunscreen. Apply sunscreen liberally and repeat throughout  the day.  Protect yourself by wearing long sleeves, pants, a wide-brimmed hat, and sunglasses when outside.  What should I know about heart disease, diabetes, and high blood pressure?  If you are 3118-37 years of age, have your blood pressure checked every 3-5 years. If you are 37 years of age or older, have your blood pressure checked every year. You should have your blood pressure measured twice-once when you are at a hospital or clinic, and once when you are not at a hospital or clinic. Record the average of the two measurements. To check your blood pressure when you are not at  a hospital or clinic, you can use: ? An automated blood pressure machine at a pharmacy. ? A home blood pressure monitor.  Talk to your health care provider about your target blood pressure.  If you are between 6845-37 years old, ask your health care provider if you should take aspirin to prevent heart disease.  Have regular diabetes screenings by checking your fasting blood sugar level. ? If you are at a normal weight and have a low risk for diabetes, have this test once every three years after the age of 37. ? If you are overweight and have a high risk for diabetes, consider being tested at a younger age or more often.  A one-time screening for abdominal aortic aneurysm (AAA) by ultrasound is recommended for men aged 65-75 years who are current or former smokers. What should I know about preventing infection? Hepatitis B If you have a higher risk for hepatitis B, you should be screened for this virus. Talk with your health care provider to find out if you are at risk for hepatitis B infection. Hepatitis C Blood testing is recommended for:  Everyone born from [id number] through 1966.  Anyone with known risk factors for hepatitis C.  Sexually Transmitted Diseases (STDs)  You should be screened each year for STDs including gonorrhea and chlamydia if: ? You are sexually active and are younger than 37 years of age. ? You are older than 37 years of age and your health care provider tells you that you are at risk for this type of infection. ? Your sexual activity has changed since you were last screened and you are at an increased risk for chlamydia or gonorrhea. Ask your health care provider if you are at risk.  Talk with your health care provider about whether you are at high risk of being infected with HIV. Your health care provider may recommend a prescription medicine to help prevent HIV infection.  What else can I do?  Schedule regular health, dental, and eye exams.  Stay current with  your vaccines (immunizations).  Do not use any tobacco products, such as cigarettes, chewing tobacco, and e-cigarettes. If you need help quitting, ask your health care provider.  Limit alcohol intake to no more than 2 drinks per day. One drink equals 12 ounces of beer, 5 ounces of wine, or 1 ounces of hard liquor.  Do not use street drugs.  Do not share needles.  Ask your health care provider for help if you need support or information about quitting drugs.  Tell your health care provider if you often feel depressed.  Tell your health care provider if you have ever been abused or do not feel safe at home. This information is not intended to replace advice given to you by your health care provider. Make sure you discuss any questions you have with your health care provider. Document  Released: 09/17/2007 Document Revised: 11/18/2015 Document Reviewed: 12/23/2014 Elsevier Interactive Patient Education  [employer].

## 2017-01-18 NOTE — Progress Notes (Signed)
Subjective:    Patient ID: Frank Shaffer, male    DOB: Sep 07, 1980, 36 y.o.   MRN: [id number]  HPI Frank Shaffer is a 36 yo male who presents today to establish care.  He is transferring to me from another provider in the same clinic. He is requesting an annual physical today.  Hypertension- controlled with lisinopril, propanolol. BP Readings from Last 3 Encounters:  01/18/17 132/76  12/30/15 128/70  11/11/15 120/70    Immunizations: Flu shot- declines. Diet: Heavy carbs-bread, pasta, rice. Eats vegetables/fruits almost daily. Meat at every meal. 1 soda a day Exercise: Does not exercise. Dental: Dental cleaning every 6 months. Vision: Not current.  Review of Systems  Constitutional: Negative for activity change, appetite change and fever.  HENT: Negative for congestion and sore throat.   Eyes: Negative for visual disturbance.  Respiratory: Negative for cough and shortness of breath.   Cardiovascular: Negative for chest pain and leg swelling.  Gastrointestinal: Positive for constipation. Negative for abdominal pain and diarrhea.  Endocrine: Negative for cold intolerance and heat intolerance.  Genitourinary: Negative for dysuria and urgency.  Musculoskeletal: Positive for back pain.  Skin: Negative for rash.  Allergic/Immunologic: Negative for environmental allergies and food allergies.  Neurological: Negative for dizziness and weakness.  Hematological: Negative for adenopathy. Does not bruise/bleed easily.  Psychiatric/Behavioral:       Denies anxiety or depression.      Past Medical History:  Diagnosis Date  . Depression   . Hemorrhoids   . Hyperlipidemia   . Hypertension   . Obese   . Pre-diabetes   . Unspecified vitamin D deficiency      Social History   Social History  . Marital status: Married    Spouse name: N/A  . Number of children: 2  . 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  Smoking status: Never Smoker  . Smokeless tobacco: Never Used  . Alcohol use Yes     Comment: occasional  . Drug use: No  . Sexual activity: Yes    Partners: Female   Other Topics Concern  . Not on file   Social History Narrative   Fun: Watch basketball, sports.   Denies religious beliefs effecting health care.     Past Surgical History:  Procedure Laterality Date  . COLONOSCOPY  10/27/2011   Procedure: COLONOSCOPY;  Surgeon: Rachael Fee, MD;  Location: [facility];  Service: Endoscopy;  Laterality: N/A;  pt is above weight limit  . WISDOM TOOTH EXTRACTION      Family History  Problem Relation Age of Onset  . Hypertension Father   . Diabetes Father   . Arthritis Father   . Heart disease Mother   . Depression Mother   . Thyroid disease Mother   . Hyperlipidemia Brother   . Colon cancer Neg Hx   . Esophageal cancer Neg Hx   . Stomach cancer Neg Hx   . Pancreatic cancer Neg Hx   . Liver disease Neg Hx     Allergies  Allergen Reactions  . Statins Other (See Comments)    Myalgias and weakness  . Latex Itching and Rash    Current Outpatient Prescriptions on File Prior to Visit  Medication Sig Dispense Refill  . albuterol (PROVENTIL HFA;VENTOLIN HFA) 108 (90 BASE) MCG/ACT inhaler Inhale 1-2 puffs into the lungs every 6 (six) hours as needed for wheezing or shortness of breath. 18  g 3  . Cholecalciferol (VITAMIN D3) 5000 UNITS TABS Take 5,000 Units by mouth daily.    . citalopram (CELEXA) 20 MG tablet Take 1 tablet (20 mg total) by mouth daily. Must keep appt w/new provider for refills 30 tablet 1  . lisinopril (PRINIVIL,ZESTRIL) 40 MG tablet TAKE 1 TABLET BY MOUTH EVERY DAY 90 tablet 0  . propranolol (INDERAL) 20 MG tablet Take 1 tablet (20 mg total) by mouth 2 (two) times daily. Must keep appt w/new provider for refills 60 tablet 1  . WELCHOL 3.75 g PACK USE 1 PACKET ONCE A DAY 90 packet 1   No current facility-administered medications on file prior to visit.      BP 132/76 (BP Location: Left Arm, Patient Position: Sitting, Cuff Size: Large)   Pulse 75   Temp 98.5 F (36.9 C) (Oral)   Resp 16   Ht 6\' 4"  (1.93 m)   Wt (!) 384 lb (174.2 kg)   SpO2 96%   BMI 46.74 kg/m       Objective:   Physical Exam Constitutional: He is oriented to person, place, and time. He appears well-developed and well-nourished. No distress.  HENT:  Head: Normocephalic and atraumatic.  Right Ear: Tympanic membrane and ear canal normal. Copious ear wax. Left Ear: Tympanic membrane and ear canal normal.  Mouth/Throat: Oropharynx is clear and moist.  Eyes: Pupils are equal, round, and reactive to light. No scleral icterus.  Neck: Normal range of motion. No thyromegaly present.  Cardiovascular: Normal rate and regular rhythm.   No murmur heard. Pulmonary/Chest: Effort normal and breath sounds normal. No respiratory distress. He has no wheezes. He has no rales. He exhibits no tenderness.  Abdominal: Soft. Bowel sounds are normal. He exhibits no distension and no mass. There is no tenderness. There is no rebound and no guarding.  Musculoskeletal: He exhibits no edema.  Lymphadenopathy:    He has no cervical adenopathy.  Neurological: He is alert and oriented to person, place, and time. He has normal patellar reflexes. He exhibits normal muscle tone. Coordination normal.  Skin: Skin is warm and dry. Acanthosis nigricans on upper back. Psychiatric: He has a normal mood and affect. His behavior is normal. Judgment and thought content normal.        Assessment & Plan:

## 2017-01-24 NOTE — Telephone Encounter (Signed)
Unfortunately, I'm not able to accept any more new patients at this time.  I'm sorry! Thank you!  

## 2017-01-25 ENCOUNTER — Other Ambulatory Visit (INDEPENDENT_AMBULATORY_CARE_PROVIDER_SITE_OTHER): Payer: 59

## 2017-01-25 DIAGNOSIS — R7303 Prediabetes: Secondary | ICD-10-CM | POA: Diagnosis not present

## 2017-01-25 DIAGNOSIS — Z0000 Encounter for general adult medical examination without abnormal findings: Secondary | ICD-10-CM

## 2017-01-25 DIAGNOSIS — E559 Vitamin D deficiency, unspecified: Secondary | ICD-10-CM

## 2017-01-25 DIAGNOSIS — Z114 Encounter for screening for human immunodeficiency virus [HIV]: Secondary | ICD-10-CM

## 2017-01-25 LAB — COMPREHENSIVE METABOLIC PANEL
ALK PHOS: 94 U/L (ref 39–117)
ALT: 27 U/L (ref 0–53)
AST: 23 U/L (ref 0–37)
Albumin: 3.9 g/dL (ref 3.5–5.2)
BUN: 16 mg/dL (ref 6–23)
CO2: 23 mEq/L (ref 19–32)
Calcium: 9.3 mg/dL (ref 8.4–10.5)
Chloride: 105 mEq/L (ref 96–112)
Creatinine, Ser: 1.16 mg/dL (ref 0.40–1.50)
GFR: 91.01 mL/min (ref >60.00)
GLUCOSE: 106 mg/dL — AB (ref 70–99)
POTASSIUM: 3.9 meq/L (ref 3.5–5.1)
SODIUM: 137 meq/L (ref 135–145)
TOTAL PROTEIN: 7.4 g/dL (ref 6.0–8.3)
Total Bilirubin: 0.7 mg/dL (ref 0.2–1.2)

## 2017-01-25 LAB — LIPID PANEL
Cholesterol: 231 mg/dL — ABNORMAL HIGH (ref 0–200)
HDL: 31.9 mg/dL — ABNORMAL LOW (ref >39.00)
LDL Cholesterol: 181 mg/dL — ABNORMAL HIGH (ref 0–99)
NONHDL: 199.55
Total CHOL/HDL Ratio: 7
Triglycerides: 95 mg/dL (ref 0.0–149.0)
VLDL: 19 mg/dL (ref 0.0–40.0)

## 2017-01-25 LAB — HEMOGLOBIN A1C: Hgb A1c MFr Bld: 5.7 % (ref 4.6–6.5)

## 2017-01-25 LAB — TSH: TSH: 1.22 u[IU]/mL (ref 0.35–4.50)

## 2017-01-25 NOTE — Telephone Encounter (Signed)
Informed patient

## 2017-01-29 LAB — VITAMIN D 1,25 DIHYDROXY
Vitamin D 1, 25 (OH)2 Total: 36 pg/mL (ref 18–72)
Vitamin D3 1, 25 (OH)2: 36 pg/mL

## 2017-01-29 LAB — HIV ANTIBODY (ROUTINE TESTING W REFLEX): HIV 1&2 Ab, 4th Generation: NONREACTIVE

## 2017-03-06 ENCOUNTER — Other Ambulatory Visit: Payer: Self-pay | Admitting: Family

## 2017-03-09 ENCOUNTER — Other Ambulatory Visit: Payer: Self-pay | Admitting: Nurse Practitioner

## 2017-03-09 ENCOUNTER — Other Ambulatory Visit: Payer: Self-pay

## 2017-03-09 MED ORDER — LISINOPRIL 40 MG PO TABS: 40.0000 mg | ORAL_TABLET | Freq: Every day | ORAL | 0 refills | Status: DC

## 2017-03-10 NOTE — Telephone Encounter (Signed)
Pt notified of Rx's sent in

## 2017-03-10 NOTE — Telephone Encounter (Signed)
Please advise on these med refills   PROPRANOLOL CITALOPRAM COLESEVELAM  Would like these called in today

## 2017-03-10 NOTE — Telephone Encounter (Signed)
Copied from CRM [phone]. Topic: Quick Communication - Rx Refill/Question >> Mar 10, 2017 10:10 AM Diana EvesHoyt, Maryann B wrote: Has the patient contacted their pharmacy? Yes.     (Agent: If no, request that the patient contact the pharmacy for the refill.)   Preferred Pharmacy (with phone number or street name): [facility]   Agent: Please be advised that RX refills may take up to 3 business days. We ask that you follow-up with your pharmacy.  Medications are citalopram and propranolol

## 2017-05-05 ENCOUNTER — Other Ambulatory Visit: Payer: Self-pay | Admitting: Nurse Practitioner

## 2017-06-04 ENCOUNTER — Other Ambulatory Visit: Payer: Self-pay | Admitting: Nurse Practitioner

## 2017-07-03 ENCOUNTER — Other Ambulatory Visit: Payer: Self-pay | Admitting: Nurse Practitioner

## 2017-08-02 DIAGNOSIS — M25561 Pain in right knee: Secondary | ICD-10-CM | POA: Diagnosis not present

## 2017-08-02 DIAGNOSIS — M1711 Unilateral primary osteoarthritis, right knee: Secondary | ICD-10-CM | POA: Diagnosis not present

## 2017-08-02 DIAGNOSIS — M170 Bilateral primary osteoarthritis of knee: Secondary | ICD-10-CM | POA: Diagnosis not present

## 2017-08-02 DIAGNOSIS — M25562 Pain in left knee: Secondary | ICD-10-CM | POA: Diagnosis not present

## 2017-08-09 DIAGNOSIS — M1711 Unilateral primary osteoarthritis, right knee: Secondary | ICD-10-CM | POA: Diagnosis not present

## 2017-08-09 DIAGNOSIS — M25561 Pain in right knee: Secondary | ICD-10-CM | POA: Diagnosis not present

## 2017-08-16 DIAGNOSIS — M25561 Pain in right knee: Secondary | ICD-10-CM | POA: Diagnosis not present

## 2017-08-16 DIAGNOSIS — M1711 Unilateral primary osteoarthritis, right knee: Secondary | ICD-10-CM | POA: Diagnosis not present

## 2017-08-23 DIAGNOSIS — M25562 Pain in left knee: Secondary | ICD-10-CM | POA: Diagnosis not present

## 2017-08-23 DIAGNOSIS — M1712 Unilateral primary osteoarthritis, left knee: Secondary | ICD-10-CM | POA: Diagnosis not present

## 2017-08-30 DIAGNOSIS — M1712 Unilateral primary osteoarthritis, left knee: Secondary | ICD-10-CM | POA: Diagnosis not present

## 2017-08-30 DIAGNOSIS — M25562 Pain in left knee: Secondary | ICD-10-CM | POA: Diagnosis not present

## 2017-09-04 ENCOUNTER — Other Ambulatory Visit: Payer: Self-pay | Admitting: Nurse Practitioner

## 2017-09-06 DIAGNOSIS — M1712 Unilateral primary osteoarthritis, left knee: Secondary | ICD-10-CM | POA: Diagnosis not present

## 2017-09-06 DIAGNOSIS — M25562 Pain in left knee: Secondary | ICD-10-CM | POA: Diagnosis not present

## 2017-11-03 ENCOUNTER — Other Ambulatory Visit: Payer: Self-pay | Admitting: Nurse Practitioner

## 2017-12-04 ENCOUNTER — Other Ambulatory Visit: Payer: Self-pay | Admitting: Nurse Practitioner

## 2018-01-01 ENCOUNTER — Other Ambulatory Visit: Payer: Self-pay | Admitting: Nurse Practitioner

## 2018-01-04 ENCOUNTER — Other Ambulatory Visit: Payer: Self-pay | Admitting: Nurse Practitioner

## 2018-01-25 ENCOUNTER — Other Ambulatory Visit: Payer: Self-pay | Admitting: Nurse Practitioner

## 2018-02-07 ENCOUNTER — Other Ambulatory Visit (INDEPENDENT_AMBULATORY_CARE_PROVIDER_SITE_OTHER): Payer: 59

## 2018-02-07 ENCOUNTER — Ambulatory Visit (INDEPENDENT_AMBULATORY_CARE_PROVIDER_SITE_OTHER): Payer: 59 | Admitting: Nurse Practitioner

## 2018-02-07 ENCOUNTER — Encounter: Payer: Self-pay | Admitting: Nurse Practitioner

## 2018-02-07 VITALS — BP 128/80 | HR 81 | Ht 76.0 in | Wt 378.0 lb

## 2018-02-07 DIAGNOSIS — E785 Hyperlipidemia, unspecified: Secondary | ICD-10-CM

## 2018-02-07 DIAGNOSIS — Z0000 Encounter for general adult medical examination without abnormal findings: Secondary | ICD-10-CM | POA: Diagnosis not present

## 2018-02-07 DIAGNOSIS — E559 Vitamin D deficiency, unspecified: Secondary | ICD-10-CM

## 2018-02-07 DIAGNOSIS — R7303 Prediabetes: Secondary | ICD-10-CM

## 2018-02-07 DIAGNOSIS — I10 Essential (primary) hypertension: Secondary | ICD-10-CM | POA: Diagnosis not present

## 2018-02-07 DIAGNOSIS — F32A Depression, unspecified: Secondary | ICD-10-CM

## 2018-02-07 DIAGNOSIS — F329 Major depressive disorder, single episode, unspecified: Secondary | ICD-10-CM

## 2018-02-07 DIAGNOSIS — F419 Anxiety disorder, unspecified: Secondary | ICD-10-CM

## 2018-02-07 LAB — VITAMIN D 25 HYDROXY (VIT D DEFICIENCY, FRACTURES): VITD: 13.11 ng/mL — AB (ref 30.00–100.00)

## 2018-02-07 LAB — COMPREHENSIVE METABOLIC PANEL
ALBUMIN: 3.9 g/dL (ref 3.5–5.2)
ALT: 26 U/L (ref 0–53)
AST: 24 U/L (ref 0–37)
Alkaline Phosphatase: 96 U/L (ref 39–117)
BILIRUBIN TOTAL: 0.6 mg/dL (ref 0.2–1.2)
BUN: 12 mg/dL (ref 6–23)
CALCIUM: 9.2 mg/dL (ref 8.4–10.5)
CO2: 22 meq/L (ref 19–32)
CREATININE: 1.11 mg/dL (ref 0.40–1.50)
Chloride: 108 mEq/L (ref 96–112)
GFR: 95.23 mL/min (ref >60.00)
Glucose, Bld: 103 mg/dL — ABNORMAL HIGH (ref 70–99)
Potassium: 4 mEq/L (ref 3.5–5.1)
Sodium: 138 mEq/L (ref 135–145)
Total Protein: 7.2 g/dL (ref 6.0–8.3)

## 2018-02-07 LAB — CBC
HEMATOCRIT: 47.2 % (ref 39.0–52.0)
Hemoglobin: 16.1 g/dL (ref 13.0–17.0)
MCHC: 34 g/dL (ref 30.0–36.0)
MCV: 88.5 fl (ref 78.0–100.0)
Platelets: 144 10*3/uL — ABNORMAL LOW (ref 150.0–400.0)
RBC: 5.34 Mil/uL (ref 4.22–5.81)
RDW: 12.8 % (ref 11.5–15.5)
WBC: 6.7 10*3/uL (ref 4.0–10.5)

## 2018-02-07 LAB — LIPID PANEL
CHOL/HDL RATIO: 7
CHOLESTEROL: 236 mg/dL — AB (ref 0–200)
HDL: 33.5 mg/dL — ABNORMAL LOW (ref >39.00)
LDL Cholesterol: 183 mg/dL — ABNORMAL HIGH (ref 0–99)
NonHDL: 202.3
TRIGLYCERIDES: 95 mg/dL (ref 0.0–149.0)
VLDL: 19 mg/dL (ref 0.0–40.0)

## 2018-02-07 LAB — HEMOGLOBIN A1C: Hgb A1c MFr Bld: 5.8 % (ref 4.6–6.5)

## 2018-02-07 MED ORDER — CITALOPRAM HYDROBROMIDE 20 MG PO TABS: 20.0000 mg | ORAL_TABLET | Freq: Every day | ORAL | 3 refills | Status: DC

## 2018-02-07 MED ORDER — LISINOPRIL 40 MG PO TABS: 40.0000 mg | ORAL_TABLET | Freq: Every day | ORAL | 3 refills | Status: DC

## 2018-02-07 NOTE — Assessment & Plan Note (Signed)
Reviewed annual screening exams, healthy lifestyle,  additional information provided on AVS - CBC; Future - Comprehensive metabolic panel; Future - Lipid panel; Future - Hemoglobin A1c; Future - VITAMIN D 25 Hydroxy (Vit-D Deficiency, Fractures); Future

## 2018-02-07 NOTE — Assessment & Plan Note (Signed)
Update labs F/U with further recommendations pending lab results - Comprehensive metabolic panel; Future - Lipid panel; Future - Hemoglobin A1c; Future

## 2018-02-07 NOTE — Assessment & Plan Note (Signed)
Stable Continue current medications Continue to monitor - CBC; Future - Comprehensive metabolic panel; Future - Lipid panel; Future 

## 2018-02-07 NOTE — Patient Instructions (Signed)
Head downstairs for labs  It was nice to see you!   Health Maintenance, Male A healthy lifestyle and preventive care is important for your health and wellness. Ask your health care provider about what schedule of regular examinations is right for you. What should I know about weight and diet? Eat a Healthy Diet  Eat plenty of vegetables, fruits, whole grains, low-fat dairy products, and lean protein.  Do not eat a lot of foods high in solid fats, added sugars, or salt.  Maintain a Healthy Weight Regular exercise can help you achieve or maintain a healthy weight. You should:  Do at least 150 minutes of exercise each week. The exercise should increase your heart rate and make you sweat (moderate-intensity exercise).  Do strength-training exercises at least twice a week.  Watch Your Levels of Cholesterol and Blood Lipids  Have your blood tested for lipids and cholesterol every 5 years starting at 38 years of age. If you are at high risk for heart disease, you should start having your blood tested when you are 38 years old. You may need to have your cholesterol levels checked more often if: ? Your lipid or cholesterol levels are high. ? You are older than 38 years of age. ? You are at high risk for heart disease.  What should I know about cancer screening? Many types of cancers can be detected early and may often be prevented. Lung Cancer  You should be screened every year for lung cancer if: ? You are a current smoker who has smoked for at least 30 years. ? You are a former smoker who has quit within the past 15 years.  Talk to your health care provider about your screening options, when you should start screening, and how often you should be screened.  Colorectal Cancer  Routine colorectal cancer screening usually begins at 38 years of age and should be repeated every 5-10 years until you are 38 years old. You may need to be screened more often if early forms of precancerous  polyps or small growths are found. Your health care provider may recommend screening at an earlier age if you have risk factors for colon cancer.  Your health care provider may recommend using home test kits to check for hidden blood in the stool.  A small camera at the end of a tube can be used to examine your colon (sigmoidoscopy or colonoscopy). This checks for the earliest forms of colorectal cancer.  Prostate and Testicular Cancer  Depending on your age and overall health, your health care provider may do certain tests to screen for prostate and testicular cancer.  Talk to your health care provider about any symptoms or concerns you have about testicular or prostate cancer.  Skin Cancer  Check your skin from head to toe regularly.  Tell your health care provider about any new moles or changes in moles, especially if: ? There is a change in a mole's size, shape, or color. ? You have a mole that is larger than a pencil eraser.  Always use sunscreen. Apply sunscreen liberally and repeat throughout the day.  Protect yourself by wearing long sleeves, pants, a wide-brimmed hat, and sunglasses when outside.  What should I know about heart disease, diabetes, and high blood pressure?  If you are 67-33 years of age, have your blood pressure checked every 3-5 years. If you are 27 years of age or older, have your blood pressure checked every year. You should have your blood pressure  measured twice-once when you are at a hospital or clinic, and once when you are not at a hospital or clinic. Record the average of the two measurements. To check your blood pressure when you are not at a hospital or clinic, you can use: ? An automated blood pressure machine at a pharmacy. ? A home blood pressure monitor.  Talk to your health care provider about your target blood pressure.  If you are between 32-9 years old, ask your health care provider if you should take aspirin to prevent heart  disease.  Have regular diabetes screenings by checking your fasting blood sugar level. ? If you are at a normal weight and have a low risk for diabetes, have this test once every three years after the age of 60. ? If you are overweight and have a high risk for diabetes, consider being tested at a younger age or more often.  A one-time screening for abdominal aortic aneurysm (AAA) by ultrasound is recommended for men aged 60-75 years who are current or former smokers. What should I know about preventing infection? Hepatitis B If you have a higher risk for hepatitis B, you should be screened for this virus. Talk with your health care provider to find out if you are at risk for hepatitis B infection. Hepatitis C Blood testing is recommended for:  Everyone born from [birth year] through 1966.  Anyone with known risk factors for hepatitis C.  Sexually Transmitted Diseases (STDs)  You should be screened each year for STDs including gonorrhea and chlamydia if: ? You are sexually active and are younger than 38 years of age. ? You are older than 38 years of age and your health care provider tells you that you are at risk for this type of infection. ? Your sexual activity has changed since you were last screened and you are at an increased risk for chlamydia or gonorrhea. Ask your health care provider if you are at risk.  Talk with your health care provider about whether you are at high risk of being infected with HIV. Your health care provider may recommend a prescription medicine to help prevent HIV infection.  What else can I do?  Schedule regular health, dental, and eye exams.  Stay current with your vaccines (immunizations).  Do not use any tobacco products, such as cigarettes, chewing tobacco, and e-cigarettes. If you need help quitting, ask your health care provider.  Limit alcohol intake to no more than 2 drinks per day. One drink equals 12 ounces of beer, 5 ounces of wine, or 1 ounces of  hard liquor.  Do not use street drugs.  Do not share needles.  Ask your health care provider for help if you need support or information about quitting drugs.  Tell your health care provider if you often feel depressed.  Tell your health care provider if you have ever been abused or do not feel safe at home. This information is not intended to replace advice given to you by your health care provider. Make sure you discuss any questions you have with your health care provider. Document Released: 09/17/2007 Document Revised: 11/18/2015 Document Reviewed: 12/23/2014 Elsevier Interactive Patient Education  [employer].

## 2018-02-07 NOTE — Assessment & Plan Note (Signed)
Stable Continue celexa at current dosage F/u for new, worsening symptoms

## 2018-02-07 NOTE — Assessment & Plan Note (Signed)
Continue welchol Update labs- he is fasting F/U with further recommendations pending lab results - CBC; Future - Lipid panel; Future

## 2018-02-07 NOTE — Progress Notes (Signed)
Frank Shaffer is a 37 y.o. male with the following history as recorded in [employer]:  Patient Active Problem List   Diagnosis Date Noted  . Routine general medical examination at a health care facility 12/31/2014  . Vitamin D deficiency   . Hypertension   . Hyperlipidemia   . Pre-diabetes   . Depression   . Obese   . Rectal bleeding 10/27/2011  . Anal fissure 09/05/2011    Current Outpatient Medications  Medication Sig Dispense Refill  . albuterol (PROVENTIL HFA;VENTOLIN HFA) 108 (90 BASE) MCG/ACT inhaler Inhale 1-2 puffs into the lungs every 6 (six) hours as needed for wheezing or shortness of breath. 18 g 3  . Cholecalciferol (VITAMIN D3) 5000 UNITS TABS Take 5,000 Units by mouth daily.    . citalopram (CELEXA) 20 MG tablet TAKE 1 TABLET (20 MG TOTAL) BY MOUTH DAILY. LAST FILL UNTIL SEEN BY PRESCRIBER 30 tablet 0  . lisinopril (PRINIVIL,ZESTRIL) 40 MG tablet TAKE 1 TABLET (40 MG TOTAL) BY MOUTH DAILY. LAST FILL UNTIL SEEN BY PRESCRIBER 30 tablet 0  . propranolol (INDERAL) 20 MG tablet Take 1 tablet (20 mg total) by mouth 2 (two) times daily. Must keep 02/07/18 appointment for future refills. 60 tablet 1  . WELCHOL 3.75 g PACK USE 1 PACKET ONCE A DAY 90 packet 1   No current facility-administered medications for this visit.     Allergies: Statins and Latex  Past Medical History:  Diagnosis Date  . Depression   . Hemorrhoids   . Hyperlipidemia   . Hypertension   . Obese   . Pre-diabetes   . Unspecified vitamin D deficiency     Past Surgical History:  Procedure Laterality Date  . COLONOSCOPY  10/27/2011   Procedure: COLONOSCOPY;  Surgeon: Rachael Fee, MD;  Location: [facility];  Service: Endoscopy;  Laterality: N/A;  pt is above weight limit  . WISDOM TOOTH EXTRACTION      Family History  Problem Relation Age of Onset  . Hypertension Father   . Diabetes Father   . Arthritis Father   . Heart disease Mother   . Depression Mother   . Thyroid disease Mother   .  Hyperlipidemia Brother   . Colon cancer Neg Hx   . Esophageal cancer Neg Hx   . Stomach cancer Neg Hx   . Pancreatic cancer Neg Hx   . Liver disease Neg Hx     Social History   Tobacco Use  . Smoking status: Never Smoker  . Smokeless tobacco: Never Used  Substance Use Topics  . Alcohol use: Yes    Comment: occasional     Subjective:  Frank Shaffer is here today for CPE  Last dental exam: 2019 Last vision exam: no routine vision exam Lipids: lipid panel ordered DM screening: a1c ordered Vaccinations: declines flu vacc Diet and exercise: no routine exercise, would like to exercise more this year  Hypertension -maintained on lisinopril 40, propanolol 20 BID Reports daily medication compliance without noted adverse medication effects. Reports regularly checks BP at home with good readings.  BP Readings from Last 3 Encounters:  02/07/18 128/80  01/18/17 132/76  12/30/15 128/70   Anxiety and depression- maintained on celexa 20 daily without any noted adverse effects and adequate control of mood  HLD- maintained on welchol 3.75g daily without noted adverse medication effects. He was on statin before but made his legs weak so he stopped taking it.  Lab Results  Component Value Date   CHOL 231 (  H) 01/25/2017   HDL 31.90 (L) 01/25/2017   LDLCALC 181 (H) 01/25/2017   TRIG 95.0 01/25/2017   CHOLHDL 7 01/25/2017    Review of Systems  Constitutional: Negative for chills and fever.  HENT: Negative for hearing loss and sore throat.   Eyes: Negative for blurred vision and double vision.  Respiratory: Negative for cough and shortness of breath.   Cardiovascular: Negative for chest pain and palpitations.  Gastrointestinal: Negative for constipation, diarrhea, nausea and vomiting.  Genitourinary: Negative for dysuria and hematuria.  Musculoskeletal: Negative for falls.  Skin: Negative for rash.  Neurological: Negative for speech change, loss of consciousness, weakness and  headaches.  Endo/Heme/Allergies: Does not bruise/bleed easily.  Psychiatric/Behavioral: Negative for depression. The patient is not nervous/anxious.     Objective:  Vitals:   02/07/18 0941  BP: 128/80  Pulse: 81  SpO2: 97%  Weight: (!) 378 lb (171.5 kg)  Height: 6\' 4"  (1.93 m)    General: Well developed, well nourished, in no acute distress  Skin : Warm and dry.  Head: Normocephalic and atraumatic  Eyes: Sclera and conjunctiva clear; pupils round and reactive to light; extraocular movements intact  Ears: External normal; canals clear; tympanic membranes normal  Oropharynx: Pink, supple. No suspicious lesions  Neck: Supple without thyromegaly Lungs: Respirations unlabored; clear to auscultation bilaterally without wheeze, rales, rhonchi  [facility] exam: normal rate, regular rhythm, normal S1, S2, no murmurs, rubs, clicks or gallops.  Abdomen: Soft; rotund; nontender;  normoactive bowel sounds; no masses or hepatosplenomegaly  Musculoskeletal: No deformities; no active joint inflammation  Extremities: No edema, cyanosis Vessels: Symmetric bilaterally  Neurologic: Alert and oriented; speech intact; face symmetrical; moves all extremities well; CNII-XII intact without focal deficit  Psychiatric: Normal mood and affect.   Assessment:  1. Routine general medical examination at a health care facility   2. Essential hypertension   3. Vitamin D deficiency   4. Pre-diabetes   5. Hyperlipidemia, unspecified hyperlipidemia type   6. Depression, unspecified depression type     Plan:   Return in about 1 year (around 02/08/2019) for CPE.  Orders Placed This Encounter  Procedures  . CBC    Standing Status:   Future    Standing Expiration Date:   02/08/2019  . Comprehensive metabolic panel    Standing Status:   Future    Standing Expiration Date:   02/08/2019  . Lipid panel    Standing Status:   Future    Standing Expiration Date:   02/08/2019  . Hemoglobin A1c    Standing Status:   Future     Standing Expiration Date:   02/08/2019  . VITAMIN D 25 Hydroxy (Vit-D Deficiency, Fractures)    Standing Status:   Future    Standing Expiration Date:   02/07/2019    Requested Prescriptions    No prescriptions requested or ordered in this encounter

## 2018-02-07 NOTE — Assessment & Plan Note (Signed)
Off vitamin D supplement per his report today Will update Vitamin D labs - VITAMIN D 25 Hydroxy (Vit-D Deficiency, Fractures); Future

## 2018-02-12 ENCOUNTER — Encounter: Payer: Self-pay | Admitting: Nurse Practitioner

## 2018-02-12 ENCOUNTER — Other Ambulatory Visit: Payer: Self-pay | Admitting: Nurse Practitioner

## 2018-02-12 DIAGNOSIS — E559 Vitamin D deficiency, unspecified: Secondary | ICD-10-CM

## 2018-02-12 DIAGNOSIS — E785 Hyperlipidemia, unspecified: Secondary | ICD-10-CM

## 2018-02-12 MED ORDER — VITAMIN D (ERGOCALCIFEROL) 1.25 MG (50000 UNIT) PO CAPS: 50000.0000 [IU] | ORAL_CAPSULE | ORAL | 0 refills | Status: DC

## 2018-03-07 ENCOUNTER — Other Ambulatory Visit: Payer: Self-pay | Admitting: Nurse Practitioner

## 2018-03-15 ENCOUNTER — Other Ambulatory Visit: Payer: Self-pay | Admitting: Nurse Practitioner

## 2018-03-21 ENCOUNTER — Ambulatory Visit: Payer: 59 | Admitting: Internal Medicine

## 2018-03-21 ENCOUNTER — Encounter: Payer: Self-pay | Admitting: Internal Medicine

## 2018-03-21 VITALS — BP 143/85 | Ht 76.0 in | Wt 370.0 lb

## 2018-03-21 DIAGNOSIS — Z9189 Other specified personal risk factors, not elsewhere classified: Secondary | ICD-10-CM | POA: Diagnosis not present

## 2018-03-21 DIAGNOSIS — E785 Hyperlipidemia, unspecified: Secondary | ICD-10-CM | POA: Diagnosis not present

## 2018-03-21 DIAGNOSIS — Z789 Other specified health status: Secondary | ICD-10-CM

## 2018-03-21 DIAGNOSIS — E8881 Metabolic syndrome: Secondary | ICD-10-CM

## 2018-03-21 NOTE — Patient Instructions (Signed)
Medication Instructions:  Dr. Rennis GoldenHilty recommends Repatha (PCSK9). This is an injectable cholesterol medication. This medication will need prior approval with your insurance company, which we will work on. If the medication is not approved initially, we may need to do an appeal with your insurance. We will keep you updated on this process. This medication can be provided at some local pharmacies or be shipped to you from a specialty pharmacy.   If you need a refill on your cardiac medications before your next appointment, please call your pharmacy.   Lab work: FASTING lab work in 3-4 months to recheck cholesterol This can be done at Dr. Blanchie DessertHilty's office or your primary care provider's office If you have labs (blood work) drawn today and your tests are completely normal, you will receive your results only by: [name]. MyChart Message (if you have MyChart) OR . A paper copy in the mail If you have any lab test that is abnormal or we need to change your treatment, we will call you to review the results.  Follow-Up: At [facility], you and your health needs are our priority.  As part of our continuing mission to provide you with exceptional heart care, we have created designated Provider Care Teams.  These Care Teams include your primary Cardiologist (physician) and Advanced Practice Providers (APPs -  Physician Assistants and Nurse Practitioners) who all work together to provide you with the care you need, when you need it. You will need a follow up appointment in 3-4 months with Dr. Rennis GoldenHilty for LIPID CLINIC   Any Other Special Instructions Will Be Listed Below (If Applicable).

## 2018-03-21 NOTE — Progress Notes (Signed)
LIPID CLINIC CONSULT NOTE  Chief Complaint:  Manage dyslipidemia  Primary Care Physician: Evaristo BuryShambley, Ashleigh N, NP  HPI:  Frank Shaffer is a 37 y.o. male who is being seen today for the evaluation of dyslipidemia at the request of Evaristo BuryShambley, Ashleigh N, NP.  This is a pleasant 37 year old male who [place] for [place].  His past medical history significant for hypertension, dyslipidemia, morbid obesity and prediabetes.  Unfortunately he is had poorly controlled cholesterol and has been intolerant to statins.  Primarily he has been on high potency atorvastatin and rosuvastatin, both of which caused debilitating myalgias and leg heaviness which completely went away when he discontinued them.  He denies any chest pain or shortness of breath.  He has no known coronary disease.  There is a family history of valvular heart disease in his mom, but no significant elevations in cholesterol or sudden cardiac death.  He reports somewhat of a varied diet which is likely atherogenic.  He says his main problem is eating too much late at night and not exerting himself.  PMHx:  Past Medical History:  Diagnosis Date  . Depression   . Hemorrhoids   . Hyperlipidemia   . Hypertension   . Obese   . Pre-diabetes   . Unspecified vitamin D deficiency     Past Surgical History:  Procedure Laterality Date  . COLONOSCOPY  10/27/2011   Procedure: COLONOSCOPY;  Surgeon: Rachael Feeaniel P Jacobs, MD;  Location: [facility];  Service: Endoscopy;  Laterality: N/A;  pt is above weight limit  . WISDOM TOOTH EXTRACTION      FAMHx:  Family History  Problem Relation Age of Onset  . Hypertension Father   . Diabetes Father   . Arthritis Father   . Heart disease Mother   . Depression Mother   . Thyroid disease Mother   . Hyperlipidemia Brother   . Colon cancer Neg Hx   . Esophageal cancer Neg Hx   . Stomach cancer Neg Hx   . Pancreatic cancer Neg Hx   . Liver disease Neg Hx     SOCHx:   reports  that he has never smoked. He has never used smokeless tobacco. He reports current alcohol use. He reports that he does not use drugs.  ALLERGIES:  Allergies  Allergen Reactions  . Statins Other (See Comments)    Myalgias and weakness  . Latex Itching and Rash    ROS: Pertinent items noted in HPI and remainder of comprehensive ROS otherwise negative.  HOME MEDS: Current Outpatient Medications on File Prior to Visit  Medication Sig Dispense Refill  . albuterol (PROVENTIL HFA;VENTOLIN HFA) 108 (90 BASE) MCG/ACT inhaler Inhale 1-2 puffs into the lungs every 6 (six) hours as needed for wheezing or shortness of breath. 18 g 3  . citalopram (CELEXA) 20 MG tablet Take 1 tablet (20 mg total) by mouth daily. 90 tablet 3  . lisinopril (PRINIVIL,ZESTRIL) 40 MG tablet Take 1 tablet (40 mg total) by mouth daily. 90 tablet 3  . propranolol (INDERAL) 20 MG tablet Take 1 tablet (20 mg total) by mouth 2 (two) times daily. 60 tablet 5  . Vitamin D, Ergocalciferol, (DRISDOL) 1.25 MG (50000 UT) CAPS capsule Take 1 capsule (50,000 Units total) by mouth every 7 (seven) days. 12 capsule 0  . WELCHOL 3.75 g PACK USE 1 PACKET ONCE A DAY 30 packet 5   No current facility-administered medications on file prior to visit.     LABS/IMAGING: No  results found for this or any previous visit (from the past 48 hour(s)). No results found.  LIPID PANEL:    Component Value Date/Time   CHOL 236 (H) 02/07/2018 1026   TRIG 95.0 02/07/2018 1026   HDL 33.50 (L) 02/07/2018 1026   CHOLHDL 7 02/07/2018 1026   VLDL 19.0 02/07/2018 1026   LDLCALC 183 (H) 02/07/2018 1026    WEIGHTS: Wt Readings from Last 3 Encounters:  03/21/18 (!) 370 lb (167.8 kg)  02/07/18 (!) 378 lb (171.5 kg)  01/18/17 (!) 384 lb (174.2 kg)    VITALS: BP (!) 143/85   Ht 6\' 4"  (1.93 m)   Wt (!) 370 lb (167.8 kg)   BMI 45.04 kg/m   EXAM: General appearance: alert, no distress and morbidly obese Neck: no carotid bruit, no JVD and thyroid  not enlarged, symmetric, no tenderness/mass/nodules Lungs: clear to auscultation bilaterally Heart: regular rate and rhythm Abdomen: soft, non-tender; bowel sounds normal; no masses,  no organomegaly Extremities: extremities normal, atraumatic, no cyanosis or edema Pulses: 2+ and symmetric Skin: Skin color, texture, turgor normal. No rashes or lesions Neurologic: Grossly normal Psych: Pleasant  EKG: Deferred  ASSESSMENT: 1. Mixed dyslipidemia 2. Morbid obesity 3. Hypertension 4. Prediabetes  PLAN: 1.   Frank Shaffer has mixed dyslipidemia with a high LDL cholesterol in the 180s.  This is been elevated for at least more than 4 years if not longer may be related to his weight and diet primarily.  It does not appear that he clinically has familial hyperlipidemia based on his family history which seems to lack evidence for elevated cholesterol or early onset heart disease.  Nonetheless, current guidelines would suggest a 50% reduction in cholesterol for patients with LDL over 190, or over 160 with multiple comorbidities.  He certainly has a metabolic syndrome and is at increased risk of coronary events.  Unfortunately has been intolerant of both atorvastatin and rosuvastatin at high potency doses.  He is unlikely to get a 50% reduction with the addition of ezetimibe or other medications to lower cholesterol.  Based on that his best chance of lipid lowering is with a PCSK9 inhibitor.  We will go ahead and try to pursue PCSK9 inhibition.  We may also consider coronary artery calcium scoring to determine if he has any early onset heart disease, but at this time he declined that testing due to cost.  Plan follow-up with me in a few months.  Chrystie Nose, MD, [facility], FACP  [place]  [facility]  Medical Director of the Advanced Lipid Disorders &  Cardiovascular Risk Reduction Clinic Diplomate of the American Board of Clinical Lipidology Attending Cardiologist  Direct Dial: [phone]   Fax: [phone]  Website:  [email or web address] Nicely Maloni Musleh 03/21/2018, 4:22 PM

## 2018-03-26 ENCOUNTER — Telehealth: Payer: Self-pay | Admitting: Internal Medicine

## 2018-03-26 NOTE — Telephone Encounter (Signed)
PA for Repatha SureClick submitted via [email or web address]  Key: [id number]

## 2018-03-30 NOTE — Telephone Encounter (Signed)
[employer] has denied coverage of Repatha SureClick 140mg /mL.   The medicine will be covered only if:  1. HeFH is confirmed by one of the following: A: both of the following - pre-treatment LDL >190mg /dL - one of the following: family h/o MI in 1st degree relative less than age 38, family h/o MI in 2nd degree relative less than age 38, family h/o LDL >190mg /dL in 1st or 2nd degree relative, family h/o FH in 1st or 2nd degree relative, family h/o tendon xanthomas and/or corneal arcus  In 1st or 2nd degree relative B: both of the following - pre-treatment LDL >190mg /dL   - submission of chart notes documenting one of the following: functional mutation in LDL/ApoB/PCSK9 gene, tendon xanthoma, arcus cornealis before age 38  2. ASCVD is confirmed by ACS, h/o MI, stable/unstable angina, coronary or arterial revascularization, stroke, TIA, PAD   Routed to MD to review and advise on denial

## 2018-03-30 NOTE — Telephone Encounter (Signed)
Ok . Does not sound like they will accept calcium score as evidence of ASCVD either? He was not interested in it . May need to try alternate statin, possibly zetia and aggressive diet. If he is unwilling to take statin, then zetia + aggressive diet.  Dr HRexene Edison

## 2018-04-09 NOTE — Telephone Encounter (Signed)
Lets try Livalo 4 mg daily if he is willing.  Dr. Rexene Edison

## 2018-04-10 MED ORDER — PITAVASTATIN MAGNESIUM 4 MG PO TABS: 4.0000 mg | ORAL_TABLET | Freq: Every day | ORAL | 3 refills | Status: DC

## 2018-04-10 NOTE — Telephone Encounter (Signed)
Patient states per MyChart message, OK to send in Rx. Rx(s) sent to pharmacy electronically. Provided link to co-pay card via MyChart

## 2018-04-10 NOTE — Telephone Encounter (Signed)
Attempted to call patient. Phone rang - no answer. Message sent via MyChart

## 2018-04-10 NOTE — Addendum Note (Signed)
Addended by: Lindell Spar on: 04/10/2018 03:23 PM   Modules accepted: Orders

## 2018-04-12 ENCOUNTER — Telehealth: Payer: Self-pay | Admitting: Internal Medicine

## 2018-04-12 NOTE — Telephone Encounter (Signed)
Per [employer], livalo 4mg  has been denied - despite submitting MD note, labs, and documentation that patient has tried/failed 2 high-potency statins.   Why was my request denied? This request was denied because you did not meet the following clinical requirements: The requested medication and/or diagnosis are not a covered benefit and excluded from coverage in accordance with the terms and conditions of your plan benefit. Therefore, the request has been administratively denied. The requested medication and/or diagnosis are not a covered benefit and are excluded from coverage in accordance with the terms and conditions of your plan benefit. Therefore, this request has been administratively denied.  Per faxed PA request, lower cost alternatives are: atorvastatin, lovastatin, pravastatin, rosuvastatin, simvastatin   Routed to MD

## 2018-04-12 NOTE — Telephone Encounter (Signed)
Appeals phone number per patient insurance card: [id number]  How do I file an appeal? You have the right to appeal this medication coverage decision within 180 calendar days from the date of this denial notification. If your prescriber submits the appeal on your behalf, be sure to have him or her include your signed authorization, allowing him or her to do so, along with a clear indication that the appeal is being requested on your behalf. Your appeal will be evaluated by a medical doctor licensed in [place] who was not involved in this original denial decision. You or your prescriber can get appeals information, including independent appeal rights, by calling our appeals coordinator at the toll-free member number listed on your health plan ID card. You can also review your plan?s prescription drug benefit information or contact your benefits office for more detailed information regarding the appeal process.  To file an appeal, please send any written comments, documents or other relevant documentation with your appeal to the address listed below: [employer] [address] [place], [place] [address] Phone: Please call the toll-free member number listed on your health plan ID card. Fax: [phone]

## 2018-04-12 NOTE — Telephone Encounter (Signed)
Will need to appeal prior auth decision - is there a number for this?  Dr. Rexene Edison

## 2018-04-12 NOTE — Telephone Encounter (Signed)
Prior authorization submitted for Livalo 4mg  (Key: [id number])

## 2018-04-26 ENCOUNTER — Other Ambulatory Visit: Payer: Self-pay | Admitting: Nurse Practitioner

## 2018-04-26 DIAGNOSIS — E559 Vitamin D deficiency, unspecified: Secondary | ICD-10-CM

## 2018-06-04 NOTE — Telephone Encounter (Signed)
Livalo denied - would recommend trying pravastatin 40 mg daily. Repeat lipid profile in 3 months.  Dr. Rexene Edison

## 2018-06-04 NOTE — Telephone Encounter (Signed)
Left message to call back  

## 2018-06-05 NOTE — Telephone Encounter (Signed)
Left message to call back  

## 2018-06-07 NOTE — Telephone Encounter (Signed)
Attempted to contact pt x 3. Left message to call back and mailed letter stating we've been trying to get in contact with him.

## 2018-06-20 ENCOUNTER — Ambulatory Visit: Payer: 59 | Admitting: Internal Medicine

## 2018-07-30 ENCOUNTER — Telehealth: Payer: Self-pay

## 2018-07-30 NOTE — Telephone Encounter (Signed)
Virtual Visit Pre-Appointment Phone Call  "(Name), I am calling you today to discuss your upcoming appointment. We are currently trying to limit exposure to the virus that causes COVID-19 by seeing patients at home rather than in the office."  1. "What is the BEST phone number to call the day of the visit?" - include this in appointment notes  2. "Do you have or have access to (through a family member/friend) a smartphone with video capability that we can use for your visit?" a. If yes - list this number in appt notes as "cell" (if different from BEST phone #) and list the appointment type as a VIDEO visit in appointment notes b. If no - list the appointment type as a PHONE visit in appointment notes  3. Confirm consent - "In the setting of the current Covid19 crisis, you are scheduled for a (phone or video) visit with your provider on (date) at (time).  Just as we do with many in-office visits, in order for you to participate in this visit, we must obtain consent.  If you'd like, I can send this to your mychart (if signed up) or email for you to review.  Otherwise, I can obtain your verbal consent now.  All virtual visits are billed to your insurance company just like a normal visit would be.  By agreeing to a virtual visit, we'd like you to understand that the technology does not allow for your provider to perform an examination, and thus may limit your provider's ability to fully assess your condition. If your provider identifies any concerns that need to be evaluated in person, we will make arrangements to do so.  Finally, though the technology is pretty good, we cannot assure that it will always work on either your or our end, and in the setting of a video visit, we may have to convert it to a phone-only visit.  In either situation, we cannot ensure that we have a secure connection.  Are you willing to proceed?" STAFF: Did the patient verbally acknowledge consent to telehealth visit? Document  YES/NO here: YES  4. Advise patient to be prepared - "Two hours prior to your appointment, go ahead and check your blood pressure, pulse, oxygen saturation, and your weight (if you have the equipment to check those) and write them all down. When your visit starts, your provider will ask you for this information. If you have an Apple Watch or Kardia device, please plan to have heart rate information ready on the day of your appointment. Please have a pen and paper handy nearby the day of the visit as well."  5. Give patient instructions for MyChart download to smartphone OR Doximity/Doxy.me as below if video visit (depending on what platform provider is using)  6. Inform patient they will receive a phone call 15 minutes prior to their appointment time (may be from unknown caller ID) so they should be prepared to answer    TELEPHONE CALL NOTE  Frank Shaffer has been deemed a candidate for a follow-up tele-health visit to limit community exposure during the Covid-19 pandemic. I spoke with the patient via phone to ensure availability of phone/video source, confirm preferred email & phone number, and discuss instructions and expectations.  I reminded Frank Shaffer to be prepared with any vital sign and/or heart rhythm information that could potentially be obtained via home monitoring, at the time of his visit. I reminded Frank Shaffer to expect a phone call prior to  his visit.  Ian Bushman, CMA 07/30/2018 4:24 PM   INSTRUCTIONS FOR DOWNLOADING THE MYCHART APP TO SMARTPHONE  - The patient must first make sure to have activated MyChart and know their login information - If Apple, go to [employer] and type in MyChart in the search bar and download the app. If Android, ask patient to go to [employer] and type in [place] in the search bar and download the app. The app is free but as with any other app downloads, their phone may require them to verify saved payment information or  Apple/Android password.  - The patient will need to then log into the app with their MyChart username and password, and select [facility] as their healthcare provider to link the account. When it is time for your visit, go to the MyChart app, find appointments, and click Begin Video Visit. Be sure to Select Allow for your device to access the Microphone and Camera for your visit. You will then be connected, and your provider will be with you shortly.  **If they have any issues connecting, or need assistance please contact MyChart service desk [phone])**  **If using a computer, in order to ensure the best quality for their visit they will need to use either of the following Internet Browsers: [employer] or Google Chrome**  IF USING DOXIMITY or DOXY.ME - The patient will receive a link just prior to their visit by text.     FULL LENGTH CONSENT FOR TELE-HEALTH VISIT   I hereby voluntarily request, consent and authorize [facility] and its employed or contracted physicians, physician assistants, nurse practitioners or other licensed health care professionals (the Practitioner), to provide me with telemedicine health care services (the "Services") as deemed necessary by the treating Practitioner. I acknowledge and consent to receive the Services by the Practitioner via telemedicine. I understand that the telemedicine visit will involve communicating with the Practitioner through live audiovisual communication technology and the disclosure of certain medical information by electronic transmission. I acknowledge that I have been given the opportunity to request an in-person assessment or other available alternative prior to the telemedicine visit and am voluntarily participating in the telemedicine visit.  I understand that I have the right to withhold or withdraw my consent to the use of telemedicine in the course of my care at any time, without affecting my right to future care  or treatment, and that the Practitioner or I may terminate the telemedicine visit at any time. I understand that I have the right to inspect all information obtained and/or recorded in the course of the telemedicine visit and may receive copies of available information for a reasonable fee.  I understand that some of the potential risks of receiving the Services via telemedicine include:  [name] Delay or interruption in medical evaluation due to technological equipment failure or disruption; . Information transmitted may not be sufficient (e.g. poor resolution of images) to allow for appropriate medical decision making by the Practitioner; and/or  . In rare instances, security protocols could fail, causing a breach of personal health information.  Furthermore, I acknowledge that it is my responsibility to provide information about my medical history, conditions and care that is complete and accurate to the best of my ability. I acknowledge that Practitioner's advice, recommendations, and/or decision may be based on factors not within their control, such as incomplete or inaccurate data provided by me or distortions of diagnostic images or specimens that may result from electronic transmissions. I  understand that the practice of medicine is not an exact science and that Practitioner makes no warranties or guarantees regarding treatment outcomes. I acknowledge that I will receive a copy of this consent concurrently upon execution via email to the email address I last provided but may also request a printed copy by calling the office of [place].    I understand that my insurance will be billed for this visit.   I have read or had this consent read to me. . I understand the contents of this consent, which adequately explains the benefits and risks of the Services being provided via telemedicine.  . I have been provided ample opportunity to ask questions regarding this consent and the Services and have had  my questions answered to my satisfaction. . I give my informed consent for the services to be provided through the use of telemedicine in my medical care  By participating in this telemedicine visit I agree to the above.

## 2018-07-31 ENCOUNTER — Telehealth: Payer: Self-pay | Admitting: Internal Medicine

## 2018-07-31 NOTE — Telephone Encounter (Signed)
Smartphone/consent/ my chart/ pre reg completed °

## 2018-08-02 ENCOUNTER — Encounter: Payer: Self-pay | Admitting: Internal Medicine

## 2018-08-02 ENCOUNTER — Telehealth (INDEPENDENT_AMBULATORY_CARE_PROVIDER_SITE_OTHER): Payer: 59 | Admitting: Internal Medicine

## 2018-08-02 VITALS — BP 128/78 | HR 80 | Ht 76.0 in | Wt 377.0 lb

## 2018-08-02 DIAGNOSIS — Z9189 Other specified personal risk factors, not elsewhere classified: Secondary | ICD-10-CM

## 2018-08-02 DIAGNOSIS — I10 Essential (primary) hypertension: Secondary | ICD-10-CM

## 2018-08-02 DIAGNOSIS — E785 Hyperlipidemia, unspecified: Secondary | ICD-10-CM

## 2018-08-02 DIAGNOSIS — Z789 Other specified health status: Secondary | ICD-10-CM

## 2018-08-02 DIAGNOSIS — E8881 Metabolic syndrome: Secondary | ICD-10-CM | POA: Diagnosis not present

## 2018-08-02 LAB — LIPID PANEL
CHOL/HDL RATIO: 7.1 ratio — AB (ref 0.0–5.0)
Cholesterol, Total: 241 mg/dL — ABNORMAL HIGH (ref 100–199)
HDL: 34 mg/dL — AB (ref >39)
LDL CALC: 188 mg/dL — AB (ref 0–99)
Triglycerides: 95 mg/dL (ref 0–149)
VLDL CHOLESTEROL CAL: 19 mg/dL (ref 5–40)

## 2018-08-02 MED ORDER — BEMPEDOIC ACID 180 MG PO TABS: 180.0000 mg | ORAL_TABLET | Freq: Every day | ORAL | 3 refills | Status: DC

## 2018-08-02 NOTE — Patient Instructions (Signed)
Medication Instructions:  Start: Nexletol 180 mg  If you need a refill on your cardiac medications before your next appointment, please call your pharmacy.   Lab work: Your physician recommends that you return for lab work in 4 months (fasting lipid)  If you have labs (blood work) drawn today and your tests are completely normal, you will receive your results only by: [name] MyChart Message (if you have MyChart) OR . A paper copy in the mail If you have any lab test that is abnormal or we need to change your treatment, we will call you to review the results.  Testing/Procedures: None  Follow-Up: At [facility], you and your health needs are our priority.  As part of our continuing mission to provide you with exceptional heart care, we have created designated Provider Care Teams.  These Care Teams include your primary Cardiologist (physician) and Advanced Practice Providers (APPs -  Physician Assistants and Nurse Practitioners) who all work together to provide you with the care you need, when you need it. You will need a follow up appointment in 4 months.  Please call our office 2 months in advance to schedule this appointment.  You may see Chrystie Nose, MD or one of the following Advanced Practice Providers on your designated Care Team: [place], [place] . Micah Flesher, PA-C

## 2018-08-02 NOTE — Addendum Note (Signed)
Addended by: Parke Poisson on: 08/02/2018 08:33 AM   Modules accepted: Orders

## 2018-08-02 NOTE — Progress Notes (Signed)
Virtual Visit via Telephone Note   This visit type was conducted due to national recommendations for restrictions regarding the COVID-19 Pandemic (e.g. social distancing) in an effort to limit this patient's exposure and mitigate transmission in our community.  Due to his co-morbid illnesses, this patient is at least at moderate risk for complications without adequate follow up.  This format is felt to be most appropriate for this patient at this time.  The patient did not have access to video technology/had technical difficulties with video requiring transitioning to audio format only (telephone).  All issues noted in this document were discussed and addressed.  No physical exam could be performed with this format.  Please refer to the patient's chart for his  consent to telehealth for [facility].   Evaluation Performed:  Telephone visit  Date:  08/02/2018   ID:  Frank Shaffer, DOB 08/27/1980, MRN [id number]  Patient Location:  [address] [place] [address]  Provider location:   [address], [address] [place], [place] [address]  PCP:  Evaristo Bury, NP  Cardiologist:  Chrystie Nose, MD Electrophysiologist:  None   Chief Complaint:  Follow-up dyslipidemia  History of Present Illness:    Frank Shaffer is a 38 y.o. male who presents via audio/video conferencing for a telehealth visit today.  Frank Shaffer was seen today in follow-up for dyslipidemia.  He has had persistently elevated LDL cholesterol and had a recent lipid profile yesterday which showed total cholesterol of 241, HDL of 34, triglycerides 95 and LDL 188.  This is essentially unchanged from his numbers 5 months ago.  He was considered for PCSK9 inhibitor since he had intolerance to high intensity rosuvastatin and atorvastatin.  This was denied because he had no history of heterozygous FH or known cardiovascular disease.  Subsequently, we trialed to tab a statin, but this was also declined  saying that he had not failed multiple statins.  Ultimately, I reluctantly recommended pravastatin, however he never got the medication filled.  Since then he is worked on diet and exercise unfortunately that has weaned off due to the COVID pandemic and his weight is gone up about 7 pounds since December.  His lipids are unchanged.  The patient does not have symptoms concerning for COVID-19 infection (fever, chills, cough, or new SHORTNESS OF BREATH).    Prior CV studies:   The following studies were reviewed today:  Lipid profile  PMHx:  Past Medical History:  Diagnosis Date   Depression    Hemorrhoids    Hyperlipidemia    Hypertension    Obese    Pre-diabetes    Unspecified vitamin D deficiency     Past Surgical History:  Procedure Laterality Date   COLONOSCOPY  10/27/2011   Procedure: COLONOSCOPY;  Surgeon: Rachael Fee, MD;  Location: [facility];  Service: Endoscopy;  Laterality: N/A;  pt is above weight limit   WISDOM TOOTH EXTRACTION      FAMHx:  Family History  Problem Relation Age of Onset   Hypertension Father    Diabetes Father    Arthritis Father    Heart disease Mother    Depression Mother    Thyroid disease Mother    Hyperlipidemia Brother    Colon cancer Neg Hx    Esophageal cancer Neg Hx    Stomach cancer Neg Hx    Pancreatic cancer Neg Hx    Liver disease Neg Hx     SOCHx:   reports that  he has never smoked. He has never used smokeless tobacco. He reports current alcohol use. He reports that he does not use drugs.  ALLERGIES:  Allergies  Allergen Reactions   Statins Other (See Comments)    Myalgias and weakness   Latex Itching and Rash    MEDS:  No outpatient medications have been marked as taking for the 08/02/18 encounter (Telemedicine) with Chrystie Nose, MD.     ROS: Pertinent items noted in HPI and remainder of comprehensive ROS otherwise negative.  Labs/Other Tests and Data Reviewed:    Recent  Labs: 02/07/2018: ALT 26; BUN 12; Creatinine, Ser 1.11; Hemoglobin 16.1; Platelets 144.0; Potassium 4.0; Sodium 138   Recent Lipid Panel Lab Results  Component Value Date/Time   CHOL 241 (H) 08/01/2018 07:44 AM   TRIG 95 08/01/2018 07:44 AM   HDL 34 (L) 08/01/2018 07:44 AM   CHOLHDL 7.1 (H) 08/01/2018 07:44 AM   CHOLHDL 7 02/07/2018 10:26 AM   LDLCALC 188 (H) 08/01/2018 07:44 AM    Wt Readings from Last 3 Encounters:  08/02/18 (!) 377 lb (171 kg)  03/21/18 (!) 370 lb (167.8 kg)  02/07/18 (!) 378 lb (171.5 kg)     Exam:    Vital Signs:  BP 128/78    Pulse 80    Ht 6\' 4"  (1.93 m)    Wt (!) 377 lb (171 kg)    BMI 45.89 kg/m    No exam due to telephone visit  ASSESSMENT & PLAN:    1. Mixed dyslipidemia 2. Morbid obesity 3. Hypertension 4. Prediabetes  Frank Shaffer has persistently elevated LDL cholesterol close to 190, and unfortunately we have not been able to get him on any lipid-lowering medications.  He was intolerant to rosuvastatin and atorvastatin.  His insurance company declined PCSK9 inhibitors as well as but have a statin.  He is adamant about not taking statin therapy.  One option would be to consider the newly approved bempedoic acid (Nexletol).  This has few of any side effects although it is only equivalent to moderate potency statin.  It is not clear if this is yet on formulary as it was FDA approved in February.  We will try to get it to him but if not then I would advise starting ezetimibe 10 mg daily in the near future.  Bempedoic acid also comes in a combination with ezetimibe which we could consider transitioning to in the future if it is on his formulary with [place] [facility].  Blood pressure is well controlled today.  Unfortunately his weight is up and we discussed ways for him to become more active during this pandemic.  COVID-19 Education: The signs and symptoms of COVID-19 were discussed with the patient and how to seek care for testing (follow up with PCP or  arrange E-visit).  The importance of social distancing was discussed today.  Patient Risk:   After full review of this patients clinical status, I feel that they are at least moderate risk at this time.  Time:   Today, I have spent 25 minutes with the patient with telehealth technology discussing lipid management, dietary changes, lifestyle changes, weight loss and exercise, hypertension management.     Medication Adjustments/Labs and Tests Ordered: Current medicines are reviewed at length with the patient today.  Concerns regarding medicines are outlined above.   Tests Ordered: No orders of the defined types were placed in this encounter.   Medication Changes: No orders of the defined types were placed in this  encounter.   Disposition:  in 4 month(s)  Chrystie NoseKenneth C. Huel Centola, MD, [facility] LL[facility]ACC, FACP  [place]   [facility]  Medical Director of the [facility] Lipid [facility]  Cardiovascular Risk Reduction Clinic Diplomate of the American Board of Clinical Lipidology Attending Cardiologist  Direct Dial: [phone]   Fax: [phone]  Website:  [email or web address]  Chrystie NoseKenneth C Rayon Mcchristian, MD  08/02/2018 8:23 AM

## 2018-09-10 ENCOUNTER — Other Ambulatory Visit: Payer: Self-pay | Admitting: *Deleted

## 2018-09-10 MED ORDER — PROPRANOLOL HCL 20 MG PO TABS: 20.0000 mg | ORAL_TABLET | Freq: Two times a day (BID) | ORAL | 3 refills | Status: DC

## 2018-10-01 ENCOUNTER — Telehealth: Payer: Self-pay | Admitting: Family

## 2018-10-01 NOTE — Telephone Encounter (Signed)
Please ask him to call Dr. Debara Pickett about the [facility] refill. It looks like the lipid clinic has taken over management and I am not sure he is supposed to continue this medication.

## 2018-10-01 NOTE — Telephone Encounter (Signed)
Spoke with patient and info given. He did say that Dr. Debara Pickett wanted him to stay on the [facility] for now as he was had originally  had him on another medication but it was too expensive and he was currently looking for an alternative. Patient said he would send Dr. Debara Pickett a my-chart message to see if would refill it. Patient voiced understanding that since he is being followed by a specialist now that they would need to manage his meds regarding his cholesterol.

## 2018-11-28 ENCOUNTER — Ambulatory Visit: Payer: 59 | Admitting: Internal Medicine

## 2018-12-27 ENCOUNTER — Other Ambulatory Visit: Payer: Self-pay | Admitting: Internal Medicine

## 2019-01-01 ENCOUNTER — Telehealth: Payer: Self-pay | Admitting: Nurse Practitioner

## 2019-01-01 NOTE — Telephone Encounter (Signed)
Copied from [place] [id number]. Topic: Appointment Scheduling - Scheduling Inquiry for Clinic >> Jan 01, 2019  3:08 PM Rutherford Nail, [place] wrote: Reason for CRM: Patient calling to check the status of refill, but needs TOC. Patient states that his parents are patient's of Dr Alain Marion. Patient states that previously Dr Alain Marion agreed to take him on as a patient. Parents are Enid Derry and [employer]. Please advise.

## 2019-01-02 NOTE — Telephone Encounter (Signed)
Okay to establish with me as a new patient.  What medicine does he need to be refilled?  Thanks

## 2019-01-03 NOTE — Telephone Encounter (Signed)
Appt has been scheduled, it is the Propranolol, please advise

## 2019-01-04 MED ORDER — PROPRANOLOL HCL 20 MG PO TABS: 20.0000 mg | ORAL_TABLET | Freq: Two times a day (BID) | ORAL | 3 refills | Status: DC

## 2019-01-04 NOTE — Addendum Note (Signed)
Addended by: Cassandria Anger on: 01/04/2019 11:20 AM   Modules accepted: Orders

## 2019-01-04 NOTE — Telephone Encounter (Signed)
Okay done.  Thanks 

## 2019-01-14 ENCOUNTER — Ambulatory Visit: Payer: 59 | Admitting: Internal Medicine

## 2019-01-30 ENCOUNTER — Encounter: Payer: Self-pay | Admitting: Internal Medicine

## 2019-01-30 ENCOUNTER — Other Ambulatory Visit: Payer: Self-pay

## 2019-01-30 ENCOUNTER — Ambulatory Visit (INDEPENDENT_AMBULATORY_CARE_PROVIDER_SITE_OTHER): Payer: 59 | Admitting: Internal Medicine

## 2019-01-30 VITALS — BP 124/78 | HR 69 | Temp 97.9°F | Ht 76.0 in | Wt 341.0 lb

## 2019-01-30 DIAGNOSIS — F419 Anxiety disorder, unspecified: Secondary | ICD-10-CM

## 2019-01-30 DIAGNOSIS — Z0000 Encounter for general adult medical examination without abnormal findings: Secondary | ICD-10-CM | POA: Diagnosis not present

## 2019-01-30 DIAGNOSIS — Z6841 Body Mass Index (BMI) 40.0 and over, adult: Secondary | ICD-10-CM

## 2019-01-30 DIAGNOSIS — E785 Hyperlipidemia, unspecified: Secondary | ICD-10-CM

## 2019-01-30 DIAGNOSIS — I10 Essential (primary) hypertension: Secondary | ICD-10-CM

## 2019-01-30 MED ORDER — PROPRANOLOL HCL 20 MG PO TABS: 20.0000 mg | ORAL_TABLET | Freq: Two times a day (BID) | ORAL | 3 refills | Status: DC

## 2019-01-30 MED ORDER — LISINOPRIL 40 MG PO TABS: 40.0000 mg | ORAL_TABLET | Freq: Every day | ORAL | 3 refills | Status: DC

## 2019-01-30 MED ORDER — CITALOPRAM HYDROBROMIDE 20 MG PO TABS: 20.0000 mg | ORAL_TABLET | Freq: Every day | ORAL | 3 refills | Status: DC

## 2019-01-30 NOTE — Assessment & Plan Note (Signed)
On Welchol CT coronary calcium test was offered

## 2019-01-30 NOTE — Patient Instructions (Addendum)
Wt Readings from Last 3 Encounters:  01/30/19 (!) 341 lb (154.7 kg)  08/02/18 (!) 377 lb (171 kg)  03/21/18 (!) 370 lb (167.8 kg)      Cardiac CT calcium scoring test $150 Tel # is [phone]   Computed tomography, more commonly known as a CT or CAT scan, is a diagnostic medical imaging test. Like traditional x-rays, it produces multiple images or pictures of the inside of the body. The cross-sectional images generated during a CT scan can be reformatted in multiple planes. They can even generate three-dimensional images. These images can be viewed on a computer monitor, printed on film or by a 3D printer, or transferred to a CD or DVD. CT images of internal organs, bones, soft tissue and blood vessels provide greater detail than traditional x-rays, particularly of soft tissues and blood vessels. A cardiac CT scan for coronary calcium is a non-invasive way of obtaining information about the presence, location and extent of calcified plaque in the coronary arteries-the vessels that supply oxygen-containing blood to the heart muscle. Calcified plaque results when there is a build-up of fat and other substances under the inner layer of the artery. This material can calcify which signals the presence of atherosclerosis, a disease of the vessel wall, also called coronary artery disease (CAD). People with this disease have an increased risk for heart attacks. In addition, over time, progression of plaque build up (CAD) can narrow the arteries or even close off blood flow to the heart. The result may be chest pain, sometimes called "angina," or a heart attack. Because calcium is a marker of CAD, the amount of calcium detected on a cardiac CT scan is a helpful prognostic tool. The findings on cardiac CT are expressed as a calcium score. Another name for this test is coronary artery calcium scoring.  What are some common uses of the procedure? The goal of cardiac CT scan for calcium scoring is to  determine if CAD is present and to what extent, even if there are no symptoms. It is a screening study that may be recommended by a physician for patients with risk factors for CAD but no clinical symptoms. The major risk factors for CAD are: . high blood cholesterol levels  . family history of heart attacks  . diabetes  . high blood pressure  . cigarette smoking  . overweight or obese  . physical inactivity   A negative cardiac CT scan for calcium scoring shows no calcification within the coronary arteries. This suggests that CAD is absent or so minimal it cannot be seen by this technique. The chance of having a heart attack over the next two to five years is very low under these circumstances. A positive test means that CAD is present, regardless of whether or not the patient is experiencing any symptoms. The amount of calcification-expressed as the calcium score-may help to predict the likelihood of a myocardial infarction (heart attack) in the coming years and helps your medical doctor or cardiologist decide whether the patient may need to take preventive medicine or undertake other measures such as diet and exercise to lower the risk for heart attack. The extent of CAD is graded according to your calcium score:  Calcium Score  Presence of CAD (coronary artery disease)  0 No evidence of CAD   1-10 Minimal evidence of CAD  11-100 Mild evidence of CAD  101-400 Moderate evidence of CAD  Over 400 Extensive evidence of CAD

## 2019-01-30 NOTE — Assessment & Plan Note (Signed)
Lisinopril 

## 2019-01-30 NOTE — Assessment & Plan Note (Signed)
Citalopram 

## 2019-01-30 NOTE — Assessment & Plan Note (Signed)
In wt management clinic w/W-F

## 2019-01-30 NOTE — Progress Notes (Signed)
Subjective:  Patient ID: Frank Shaffer, male    DOB: 01-02-1981  Age: 38 y.o. MRN: [id number]  CC: No chief complaint on file.   HPI Yehuda L Dais presents for HTN, anxiety, dyslipidemia f/u In wt management clinic w/W-F  Outpatient Medications Prior to Visit  Medication Sig Dispense Refill  . albuterol (PROVENTIL HFA;VENTOLIN HFA) 108 (90 BASE) MCG/ACT inhaler Inhale 1-2 puffs into the lungs every 6 (six) hours as needed for wheezing or shortness of breath. 18 g 3  . Bempedoic Acid (NEXLETOL) 180 MG TABS Take 180 mg by mouth daily. 90 tablet 3  . citalopram (CELEXA) 20 MG tablet Take 1 tablet (20 mg total) by mouth daily. 90 tablet 3  . lisinopril (ZESTRIL) 40 MG tablet Take by mouth.    . propranolol (INDERAL) 20 MG tablet Take 1 tablet (20 mg total) by mouth 2 (two) times daily. NEED TO ESTABLISH WITH NEW PROVIDER. 60 tablet 3  . Vitamin D, Ergocalciferol, (DRISDOL) 1.25 MG (50000 UT) CAPS capsule Take 1 capsule (50,000 Units total) by mouth every 7 (seven) days. 12 capsule 0  . WELCHOL 3.75 g PACK USE 1 PACKET ONCE A DAY 30 packet 5  . lisinopril (PRINIVIL,ZESTRIL) 40 MG tablet Take 1 tablet (40 mg total) by mouth daily. 90 tablet 3   No facility-administered medications prior to visit.     ROS: Review of Systems  Constitutional: Negative for appetite change, fatigue and unexpected weight change.  HENT: Negative for congestion, nosebleeds, sneezing, sore throat and trouble swallowing.   Eyes: Negative for itching and visual disturbance.  Respiratory: Negative for cough.   Cardiovascular: Negative for chest pain, palpitations and leg swelling.  Gastrointestinal: Negative for abdominal distention, blood in stool, diarrhea and nausea.  Genitourinary: Negative for frequency and hematuria.  Musculoskeletal: Negative for back pain, gait problem, joint swelling and neck pain.  Skin: Negative for rash.  Neurological: Negative for dizziness, tremors, speech difficulty and  weakness.  Psychiatric/Behavioral: Negative for agitation, dysphoric mood, sleep disturbance and suicidal ideas. The patient is nervous/anxious.     Objective:  BP 124/78 (BP Location: Left Arm, Patient Position: Sitting, Cuff Size: Large)   Pulse 69   Temp 97.9 F (36.6 C) (Oral)   Ht 6\' 4"  (1.93 m)   Wt (!) 341 lb (154.7 kg)   SpO2 97%   BMI 41.51 kg/m   BP Readings from Last 3 Encounters:  01/30/19 124/78  08/02/18 128/78  03/21/18 (!) 143/85    Wt Readings from Last 3 Encounters:  01/30/19 (!) 341 lb (154.7 kg)  08/02/18 (!) 377 lb (171 kg)  03/21/18 (!) 370 lb (167.8 kg)    Physical Exam Constitutional:      General: He is not in acute distress.    Appearance: He is well-developed. He is obese.     Comments: NAD  Eyes:     Conjunctiva/sclera: Conjunctivae normal.     Pupils: Pupils are equal, round, and reactive to light.  Neck:     Musculoskeletal: Normal range of motion.     Thyroid: No thyromegaly.     Vascular: No JVD.  Cardiovascular:     Rate and Rhythm: Normal rate and regular rhythm.     Heart sounds: Normal heart sounds. No murmur. No friction rub. No gallop.   Pulmonary:     Effort: Pulmonary effort is normal. No respiratory distress.     Breath sounds: Normal breath sounds. No wheezing or rales.  Chest:     Chest  wall: No tenderness.  Abdominal:     General: Bowel sounds are normal. There is no distension.     Palpations: Abdomen is soft. There is no mass.     Tenderness: There is no abdominal tenderness. There is no guarding or rebound.  Musculoskeletal: Normal range of motion.        General: No tenderness.  Lymphadenopathy:     Cervical: No cervical adenopathy.  Skin:    General: Skin is warm and dry.     Findings: No rash.  Neurological:     Mental Status: He is alert and oriented to person, place, and time.     Cranial Nerves: No cranial nerve deficit.     Motor: No abnormal muscle tone.     Coordination: Coordination normal.      Gait: Gait normal.     Deep Tendon Reflexes: Reflexes are normal and symmetric.  Psychiatric:        Behavior: Behavior normal.        Thought Content: Thought content normal.        Judgment: Judgment normal.     Lab Results  Component Value Date   WBC 6.7 02/07/2018   HGB 16.1 02/07/2018   HCT 47.2 02/07/2018   PLT 144.0 (L) 02/07/2018   GLUCOSE 103 (H) 02/07/2018   CHOL 241 (H) 08/01/2018   TRIG 95 08/01/2018   HDL 34 (L) 08/01/2018   LDLCALC 188 (H) 08/01/2018   ALT 26 02/07/2018   AST 24 02/07/2018   NA 138 02/07/2018   K 4.0 02/07/2018   CL 108 02/07/2018   CREATININE 1.11 02/07/2018   BUN 12 02/07/2018   CO2 22 02/07/2018   TSH 1.22 01/25/2017   HGBA1C 5.8 02/07/2018   MICROALBUR 0.7 01/21/2014    No results found.  Assessment & Plan:   There are no diagnoses linked to this encounter.   No orders of the defined types were placed in this encounter.    Follow-up: No follow-ups on file.  Sonda Primes, MD

## 2019-07-31 ENCOUNTER — Ambulatory Visit (INDEPENDENT_AMBULATORY_CARE_PROVIDER_SITE_OTHER): Payer: 59 | Admitting: Internal Medicine

## 2019-07-31 ENCOUNTER — Encounter: Payer: Self-pay | Admitting: Internal Medicine

## 2019-07-31 ENCOUNTER — Other Ambulatory Visit: Payer: Self-pay

## 2019-07-31 DIAGNOSIS — I10 Essential (primary) hypertension: Secondary | ICD-10-CM

## 2019-07-31 DIAGNOSIS — Z0000 Encounter for general adult medical examination without abnormal findings: Secondary | ICD-10-CM

## 2019-07-31 DIAGNOSIS — R7303 Prediabetes: Secondary | ICD-10-CM

## 2019-07-31 DIAGNOSIS — E559 Vitamin D deficiency, unspecified: Secondary | ICD-10-CM | POA: Diagnosis not present

## 2019-07-31 DIAGNOSIS — E785 Hyperlipidemia, unspecified: Secondary | ICD-10-CM

## 2019-07-31 LAB — BASIC METABOLIC PANEL
BUN: 22 mg/dL (ref 6–23)
CO2: 23 mEq/L (ref 19–32)
Calcium: 9.3 mg/dL (ref 8.4–10.5)
Chloride: 106 mEq/L (ref 96–112)
Creatinine, Ser: 1.07 mg/dL (ref 0.40–1.50)
GFR: 92.76 mL/min (ref >60.00)
Glucose, Bld: 97 mg/dL (ref 70–99)
Potassium: 4.3 mEq/L (ref 3.5–5.1)
Sodium: 139 mEq/L (ref 135–145)

## 2019-07-31 LAB — CBC WITH DIFFERENTIAL/PLATELET
Basophils Absolute: 0 10*3/uL (ref 0.0–0.1)
Basophils Relative: 0.6 % (ref 0.0–3.0)
Eosinophils Absolute: 0.2 10*3/uL (ref 0.0–0.7)
Eosinophils Relative: 2.9 % (ref 0.0–5.0)
HCT: 48.3 % (ref 39.0–52.0)
Hemoglobin: 16.4 g/dL (ref 13.0–17.0)
Lymphocytes Relative: 27.7 % (ref 12.0–46.0)
Lymphs Abs: 1.6 10*3/uL (ref 0.7–4.0)
MCHC: 34 g/dL (ref 30.0–36.0)
MCV: 90.2 fl (ref 78.0–100.0)
Monocytes Absolute: 0.5 10*3/uL (ref 0.1–1.0)
Monocytes Relative: 8.8 % (ref 3.0–12.0)
Neutro Abs: 3.4 10*3/uL (ref 1.4–7.7)
Neutrophils Relative %: 60 % (ref 43.0–77.0)
Platelets: 132 10*3/uL — ABNORMAL LOW (ref 150.0–400.0)
RBC: 5.35 Mil/uL (ref 4.22–5.81)
RDW: 12.5 % (ref 11.5–15.5)
WBC: 5.7 10*3/uL (ref 4.0–10.5)

## 2019-07-31 LAB — LIPID PANEL
Cholesterol: 181 mg/dL (ref 0–200)
HDL: 32.4 mg/dL — ABNORMAL LOW (ref >39.00)
LDL Cholesterol: 139 mg/dL — ABNORMAL HIGH (ref 0–99)
NonHDL: 148.57
Total CHOL/HDL Ratio: 6
Triglycerides: 47 mg/dL (ref 0.0–149.0)
VLDL: 9.4 mg/dL (ref 0.0–40.0)

## 2019-07-31 LAB — TSH: TSH: 1.16 u[IU]/mL (ref 0.35–4.50)

## 2019-07-31 LAB — HEPATIC FUNCTION PANEL
ALT: 24 U/L (ref 0–53)
AST: 23 U/L (ref 0–37)
Albumin: 4 g/dL (ref 3.5–5.2)
Alkaline Phosphatase: 101 U/L (ref 39–117)
Bilirubin, Direct: 0.1 mg/dL (ref 0.0–0.3)
Total Bilirubin: 0.4 mg/dL (ref 0.2–1.2)
Total Protein: 7.5 g/dL (ref 6.0–8.3)

## 2019-07-31 LAB — URINALYSIS
Bilirubin Urine: NEGATIVE
Hgb urine dipstick: NEGATIVE
Ketones, ur: NEGATIVE
Leukocytes,Ua: NEGATIVE
Nitrite: NEGATIVE
Specific Gravity, Urine: 1.025 (ref 1.000–1.030)
Total Protein, Urine: NEGATIVE
Urine Glucose: NEGATIVE
Urobilinogen, UA: 0.2 (ref 0.0–1.0)
pH: 6 (ref 5.0–8.0)

## 2019-07-31 LAB — HEMOGLOBIN A1C: Hgb A1c MFr Bld: 5.4 % (ref 4.6–6.5)

## 2019-07-31 LAB — VITAMIN D 25 HYDROXY (VIT D DEFICIENCY, FRACTURES): VITD: 65.67 ng/mL (ref 30.00–100.00)

## 2019-07-31 NOTE — Progress Notes (Signed)
Subjective:  Patient ID: Frank Shaffer, male    DOB: 04-Nov-1980  Age: 39 y.o. MRN: [id number]  CC: No chief complaint on file.   HPI Frank Shaffer presents for a well exam  Lost a lot of wt - [facility] Medications Prior to Visit  Medication Sig Dispense Refill  . Cholecalciferol (VITAMIN D3) 50 MCG (2000 UT) TABS Take by mouth.    . citalopram (CELEXA) 20 MG tablet Take 1 tablet (20 mg total) by mouth daily. 90 tablet 3  . lisinopril (ZESTRIL) 40 MG tablet Take 1 tablet (40 mg total) by mouth daily. 90 tablet 3  . propranolol (INDERAL) 20 MG tablet Take 1 tablet (20 mg total) by mouth 2 (two) times daily. NEED TO ESTABLISH WITH NEW PROVIDER. 180 tablet 3  . albuterol (PROVENTIL HFA;VENTOLIN HFA) 108 (90 BASE) MCG/ACT inhaler Inhale 1-2 puffs into the lungs every 6 (six) hours as needed for wheezing or shortness of breath. (Patient not taking: Reported on 07/31/2019) 18 g 3  . Bempedoic Acid (NEXLETOL) 180 MG TABS Take 180 mg by mouth daily. (Patient not taking: Reported on 07/31/2019) 90 tablet 3  . Vitamin D, Ergocalciferol, (DRISDOL) 1.25 MG (50000 UT) CAPS capsule Take 1 capsule (50,000 Units total) by mouth every 7 (seven) days. (Patient not taking: Reported on 07/31/2019) 12 capsule 0  . WELCHOL 3.75 g PACK USE 1 PACKET ONCE A DAY (Patient not taking: Reported on 07/31/2019) 30 packet 5   No facility-administered medications prior to visit.    ROS: Review of Systems  Constitutional: Negative for appetite change, fatigue and unexpected weight change.  HENT: Negative for congestion, nosebleeds, sneezing, sore throat and trouble swallowing.   Eyes: Negative for itching and visual disturbance.  Respiratory: Negative for cough.   Cardiovascular: Negative for chest pain, palpitations and leg swelling.  Gastrointestinal: Negative for abdominal distention, blood in stool, diarrhea and nausea.  Genitourinary: Negative for frequency and hematuria.    Musculoskeletal: Negative for back pain, gait problem, joint swelling and neck pain.  Skin: Negative for rash.  Neurological: Negative for dizziness, tremors, speech difficulty and weakness.  Psychiatric/Behavioral: Negative for agitation, dysphoric mood, sleep disturbance and suicidal ideas. The patient is not nervous/anxious.     Objective:  BP 120/74 (BP Location: Left Arm, Patient Position: Sitting, Cuff Size: Large)   Pulse 70   Temp 98.3 F (36.8 C) (Oral)   Ht 6\' 4"  (1.93 m)   Wt (!) 311 lb (141.1 kg)   SpO2 97%   BMI 37.86 kg/m   BP Readings from Last 3 Encounters:  07/31/19 120/74  01/30/19 124/78  08/02/18 128/78    Wt Readings from Last 3 Encounters:  07/31/19 (!) 311 lb (141.1 kg)  01/30/19 (!) 341 lb (154.7 kg)  08/02/18 (!) 377 lb (171 kg)    Physical Exam Constitutional:      General: He is not in acute distress.    Appearance: He is well-developed.     Comments: NAD  Eyes:     Conjunctiva/sclera: Conjunctivae normal.     Pupils: Pupils are equal, round, and reactive to light.  Neck:     Thyroid: No thyromegaly.     Vascular: No JVD.  Cardiovascular:     Rate and Rhythm: Normal rate and regular rhythm.     Heart sounds: Normal heart sounds. No murmur. No friction rub. No gallop.   Pulmonary:     Effort: Pulmonary effort is normal. No respiratory distress.  Breath sounds: Normal breath sounds. No wheezing or rales.  Chest:     Chest wall: No tenderness.  Abdominal:     General: Bowel sounds are normal. There is no distension.     Palpations: Abdomen is soft. There is no mass.     Tenderness: There is no abdominal tenderness. There is no guarding or rebound.  Musculoskeletal:        General: No tenderness. Normal range of motion.     Cervical back: Normal range of motion.  Lymphadenopathy:     Cervical: No cervical adenopathy.  Skin:    General: Skin is warm and dry.     Findings: No rash.  Neurological:     Mental Status: He is alert and  oriented to person, place, and time.     Cranial Nerves: No cranial nerve deficit.     Motor: No abnormal muscle tone.     Coordination: Coordination normal.     Gait: Gait normal.     Deep Tendon Reflexes: Reflexes are normal and symmetric.  Psychiatric:        Behavior: Behavior normal.        Thought Content: Thought content normal.        Judgment: Judgment normal.     Lab Results  Component Value Date   WBC 6.7 02/07/2018   HGB 16.1 02/07/2018   HCT 47.2 02/07/2018   PLT 144.0 (L) 02/07/2018   GLUCOSE 103 (H) 02/07/2018   CHOL 241 (H) 08/01/2018   TRIG 95 08/01/2018   HDL 34 (L) 08/01/2018   LDLCALC 188 (H) 08/01/2018   ALT 26 02/07/2018   AST 24 02/07/2018   NA 138 02/07/2018   K 4.0 02/07/2018   CL 108 02/07/2018   CREATININE 1.11 02/07/2018   BUN 12 02/07/2018   CO2 22 02/07/2018   TSH 1.22 01/25/2017   HGBA1C 5.8 02/07/2018   MICROALBUR 0.7 01/21/2014    No results found.  Assessment & Plan:    Sonda Primes, MD

## 2019-07-31 NOTE — Assessment & Plan Note (Signed)
Vit D 

## 2019-07-31 NOTE — Assessment & Plan Note (Signed)
  We discussed age appropriate health related issues, including available/recomended screening tests and vaccinations. Labs were ordered to be later reviewed . All questions were answered. We discussed one or more of the following - seat belt use, use of sunscreen/sun exposure exercise, safe sex, fall risk reduction, second hand smoke exposure, firearm use and storage, seat belt use, a need for adhering to healthy diet and exercise. Labs were ordered .  All questions were answered. Lost wt on diet.

## 2019-07-31 NOTE — Assessment & Plan Note (Signed)
Lost 70 lbs Labs

## 2019-07-31 NOTE — Patient Instructions (Signed)

## 2019-07-31 NOTE — Assessment & Plan Note (Signed)
Lost 70 lbs

## 2019-07-31 NOTE — Assessment & Plan Note (Signed)
Lisinopril 

## 2019-10-15 ENCOUNTER — Telehealth: Payer: Self-pay | Admitting: Internal Medicine

## 2019-10-15 MED ORDER — LISINOPRIL 40 MG PO TABS: 40.0000 mg | ORAL_TABLET | Freq: Every day | ORAL | 3 refills | Status: DC

## 2019-10-15 NOTE — Telephone Encounter (Signed)
    Patient requesting refill on lisinopril (ZESTRIL) 40 MG tablet Pharmacy [id number] - [place], [place] - [address]

## 2019-10-15 NOTE — Telephone Encounter (Signed)
See 10/15/2019 med refill

## 2019-11-06 ENCOUNTER — Ambulatory Visit (HOSPITAL_COMMUNITY)
Admission: EM | Admit: 2019-11-06 | Discharge: 2019-11-06 | Disposition: A | Discharge status: Home / Self Care | Payer: 59 | Attending: Family Medicine | Admitting: Family Medicine

## 2019-11-06 ENCOUNTER — Other Ambulatory Visit: Payer: Self-pay

## 2019-11-06 ENCOUNTER — Encounter (HOSPITAL_COMMUNITY): Payer: Self-pay

## 2019-11-06 DIAGNOSIS — R21 Rash and other nonspecific skin eruption: Secondary | ICD-10-CM

## 2019-11-06 MED ORDER — PREDNISONE 10 MG (21) PO TBPK
ORAL_TABLET | Freq: Every day | ORAL | 0 refills | Status: DC
Start: 2019-11-06 — End: 2020-11-04

## 2019-11-06 NOTE — ED Triage Notes (Signed)
Pt presents to [facility] for rash on left arm and leg. Rash appears to have small red bumps on arm and leg. Pt states rash is itchy, with some pain. Pt has been treating with Cortizone cream with out relief.

## 2019-11-07 NOTE — ED Provider Notes (Signed)
[facility]   [id number] 11/06/19 Arrival Time: 1406  ASSESSMENT & PLAN:  1. Rash and nonspecific skin eruption     Unclear etiology.  Begin trial of: Meds ordered this encounter  Medications  . predniSONE (STERAPRED UNI-PAK 21 TAB) 10 MG (21) TBPK tablet    Sig: Take by mouth daily. Take as directed.    Dispense:  21 tablet    Refill:  0    No signs of skin infection.  Will follow up with PCP or here if worsening or failing to improve as anticipated. Reviewed expectations re: course of current medical issues. Questions answered. Outlined signs and symptoms indicating need for more acute intervention. Patient verbalized understanding. After Visit Summary given.   SUBJECTIVE:  Frank Shaffer is a 39 y.o. male who presents with a skin complaint. Reports "rash that comes every summer"; L forearm and leg; some L neck. Mild itching. Cortisone cream without relief. Afebrile. No new exposures.   OBJECTIVE: Vitals:   11/06/19 1516  BP: 139/74  Pulse: (!) 101  Resp: 16  Temp: 98.1 F (36.7 C)  TempSrc: Oral  SpO2: 100%    General appearance: alert; no distress HEENT: [place]; AT Neck: supple with FROM Lungs: clear to auscultation bilaterally Heart: regular rate and rhythm Extremities: no edema; moves all extremities normally Skin: warm and dry; patches of erythema slightly raised wheals over left forearm and neck Psychological: alert and cooperative; normal mood and affect  Allergies  Allergen Reactions  . Statins Other (See Comments)    Myalgias and weakness  . Latex Itching and Rash    Past Medical History:  Diagnosis Date  . Depression   . Hemorrhoids   . Hyperlipidemia   . Hypertension   . Obese   . Pre-diabetes   . Unspecified vitamin D deficiency    Social History   Socioeconomic History  . Marital status: Married    Spouse name: Not on file  . Number of children: 2  . Years of education: 90  . Highest education level: Not on file    Occupational History  . Occupation: Research officer, political party  Tobacco Use  . Smoking status: Never Smoker  . Smokeless tobacco: Never Used  Substance and Sexual Activity  . Alcohol use: Yes    Comment: occasional  . Drug use: No  . Sexual activity: Yes    Partners: Female  Other Topics Concern  . Not on file  Social History Narrative   Fun: Watch basketball, sports.   Denies religious beliefs effecting health care.    Social Determinants of Health   Financial Resource Strain:   . Difficulty of Paying Living Expenses:   Food Insecurity:   . Worried About Programme researcher, broadcasting/film/video in the Last Year:   . Barista in the Last Year:   Transportation Needs:   . Freight forwarder (Medical):   [name] Lack of Transportation (Non-Medical):   Physical Activity:   . Days of Exercise per Week:   . Minutes of Exercise per Session:   Stress:   . Feeling of Stress :   Social Connections:   . Frequency of Communication with Friends and Family:   . Frequency of Social Gatherings with Friends and Family:   . Attends Religious Services:   . Active Member of Clubs or Organizations:   . Attends Banker Meetings:   [name] Marital Status:   Intimate Partner Violence:   . Fear of Current or Ex-Partner:   .  Emotionally Abused:   [name] Physically Abused:   . Sexually Abused:    Family History  Problem Relation Age of Onset  . Hypertension Father   . Diabetes Father   . Arthritis Father   . Heart disease Mother        A fib  . Depression Mother   . Thyroid disease Mother   . Hyperlipidemia Brother   . Colon cancer Neg Hx   . Esophageal cancer Neg Hx   . Stomach cancer Neg Hx   . Pancreatic cancer Neg Hx   . Liver disease Neg Hx    Past Surgical History:  Procedure Laterality Date  . COLONOSCOPY  10/27/2011   Procedure: COLONOSCOPY;  Surgeon: Rachael Fee, MD;  Location: [facility];  Service: Endoscopy;  Laterality: N/A;  pt is above weight limit  . WISDOM TOOTH EXTRACTION        Mardella Layman, MD 11/07/19 [phone]

## 2020-02-21 ENCOUNTER — Other Ambulatory Visit: Payer: Self-pay | Admitting: Internal Medicine

## 2020-08-02 ENCOUNTER — Other Ambulatory Visit: Payer: Self-pay | Admitting: Internal Medicine

## 2020-09-04 ENCOUNTER — Other Ambulatory Visit: Payer: Self-pay | Admitting: Internal Medicine

## 2020-09-04 NOTE — Telephone Encounter (Signed)
Refilled propranolol (INDERAL) 20 MG tablet for 30 days, 0 refills. Pt needs OV for additional refills.

## 2020-10-09 ENCOUNTER — Other Ambulatory Visit: Payer: Self-pay | Admitting: Internal Medicine

## 2020-10-22 ENCOUNTER — Other Ambulatory Visit: Payer: Self-pay

## 2020-10-22 ENCOUNTER — Telehealth: Payer: Self-pay

## 2020-10-22 NOTE — Telephone Encounter (Signed)
pt has stated he needs a rx refill for propranolol (INDERAL) 20 MG tablet.

## 2020-10-22 NOTE — Telephone Encounter (Signed)
Pt is overdue for appt last saw MD 07/2019. A msg and mychart msg was sent to patient notifying previously along w/ the 30 day refill.Marland KitchenRaechel Chute

## 2020-11-04 ENCOUNTER — Encounter: Payer: Self-pay | Admitting: Internal Medicine

## 2020-11-04 ENCOUNTER — Other Ambulatory Visit: Payer: Self-pay

## 2020-11-04 ENCOUNTER — Ambulatory Visit: Payer: 59 | Admitting: Internal Medicine

## 2020-11-04 VITALS — BP 128/76 | HR 82 | Temp 98.2°F | Ht 76.0 in | Wt 276.4 lb

## 2020-11-04 DIAGNOSIS — I10 Essential (primary) hypertension: Secondary | ICD-10-CM

## 2020-11-04 DIAGNOSIS — E559 Vitamin D deficiency, unspecified: Secondary | ICD-10-CM

## 2020-11-04 DIAGNOSIS — Z125 Encounter for screening for malignant neoplasm of prostate: Secondary | ICD-10-CM

## 2020-11-04 DIAGNOSIS — E66813 Obesity, class 3: Secondary | ICD-10-CM

## 2020-11-04 DIAGNOSIS — Z6841 Body Mass Index (BMI) 40.0 and over, adult: Secondary | ICD-10-CM

## 2020-11-04 DIAGNOSIS — Z0000 Encounter for general adult medical examination without abnormal findings: Secondary | ICD-10-CM

## 2020-11-04 DIAGNOSIS — F419 Anxiety disorder, unspecified: Secondary | ICD-10-CM | POA: Diagnosis not present

## 2020-11-04 LAB — CBC WITH DIFFERENTIAL/PLATELET
Basophils Absolute: 0 10*3/uL (ref 0.0–0.1)
Basophils Relative: 0.6 % (ref 0.0–3.0)
Eosinophils Absolute: 0.1 10*3/uL (ref 0.0–0.7)
Eosinophils Relative: 1.9 % (ref 0.0–5.0)
HCT: 45.2 % (ref 39.0–52.0)
Hemoglobin: 15.1 g/dL (ref 13.0–17.0)
Lymphocytes Relative: 26.2 % (ref 12.0–46.0)
Lymphs Abs: 1.7 10*3/uL (ref 0.7–4.0)
MCHC: 33.4 g/dL (ref 30.0–36.0)
MCV: 91.2 fl (ref 78.0–100.0)
Monocytes Absolute: 0.5 10*3/uL (ref 0.1–1.0)
Monocytes Relative: 7.5 % (ref 3.0–12.0)
Neutro Abs: 4.1 10*3/uL (ref 1.4–7.7)
Neutrophils Relative %: 63.8 % (ref 43.0–77.0)
Platelets: 129 10*3/uL — ABNORMAL LOW (ref 150.0–400.0)
RBC: 4.96 Mil/uL (ref 4.22–5.81)
RDW: 12.7 % (ref 11.5–15.5)
WBC: 6.4 10*3/uL (ref 4.0–10.5)

## 2020-11-04 LAB — LIPID PANEL
Cholesterol: 167 mg/dL (ref 0–200)
HDL: 35.8 mg/dL — ABNORMAL LOW (ref >39.00)
LDL Cholesterol: 119 mg/dL — ABNORMAL HIGH (ref 0–99)
NonHDL: 130.77
Total CHOL/HDL Ratio: 5
Triglycerides: 57 mg/dL (ref 0.0–149.0)
VLDL: 11.4 mg/dL (ref 0.0–40.0)

## 2020-11-04 LAB — COMPREHENSIVE METABOLIC PANEL
ALT: 20 U/L (ref 0–53)
AST: 22 U/L (ref 0–37)
Albumin: 3.9 g/dL (ref 3.5–5.2)
Alkaline Phosphatase: 88 U/L (ref 39–117)
BUN: 11 mg/dL (ref 6–23)
CO2: 27 mEq/L (ref 19–32)
Calcium: 9 mg/dL (ref 8.4–10.5)
Chloride: 106 mEq/L (ref 96–112)
Creatinine, Ser: 1.07 mg/dL (ref 0.40–1.50)
GFR: 86.52 mL/min (ref >60.00)
Glucose, Bld: 84 mg/dL (ref 70–99)
Potassium: 4.1 mEq/L (ref 3.5–5.1)
Sodium: 142 mEq/L (ref 135–145)
Total Bilirubin: 0.8 mg/dL (ref 0.2–1.2)
Total Protein: 6.9 g/dL (ref 6.0–8.3)

## 2020-11-04 LAB — URINALYSIS
Bilirubin Urine: NEGATIVE
Hgb urine dipstick: NEGATIVE
Ketones, ur: NEGATIVE
Leukocytes,Ua: NEGATIVE
Nitrite: NEGATIVE
Specific Gravity, Urine: >=1.03 — AB (ref 1.000–1.030)
Total Protein, Urine: NEGATIVE
Urine Glucose: NEGATIVE
Urobilinogen, UA: 0.2 (ref 0.0–1.0)
pH: 6 (ref 5.0–8.0)

## 2020-11-04 LAB — TSH: TSH: 1.05 u[IU]/mL (ref 0.35–5.50)

## 2020-11-04 LAB — PSA: PSA: 0.29 ng/mL (ref 0.10–4.00)

## 2020-11-04 MED ORDER — LISINOPRIL 20 MG PO TABS: 20.0000 mg | ORAL_TABLET | Freq: Every day | ORAL | 3 refills | Status: DC

## 2020-11-04 MED ORDER — CITALOPRAM HYDROBROMIDE 20 MG PO TABS: 20.0000 mg | ORAL_TABLET | Freq: Every day | ORAL | 3 refills | Status: DC

## 2020-11-04 MED ORDER — PROPRANOLOL HCL 20 MG PO TABS: 20.0000 mg | ORAL_TABLET | Freq: Two times a day (BID) | ORAL | 3 refills | Status: DC

## 2020-11-04 NOTE — Patient Instructions (Signed)

## 2020-11-04 NOTE — Progress Notes (Addendum)
Subjective:  Patient ID: Frank Shaffer, male    DOB: [birth year]  Age: 40 y.o. MRN: [id number]  CC: Medication Refill (Need propanolol)   HPI Frank Shaffer presents for HTN, anxiety Well exam  Outpatient Medications Prior to Visit  Medication Sig Dispense Refill   Cholecalciferol (VITAMIN D3) 50 MCG (2000 UT) TABS Take by mouth.     citalopram (CELEXA) 20 MG tablet TAKE 1 TABLET BY MOUTH EVERY DAY 30 tablet 5   lisinopril (ZESTRIL) 40 MG tablet Take 1 tablet (40 mg total) by mouth daily. (Patient taking differently: Take 20 mg by mouth daily.) 90 tablet 3   propranolol (INDERAL) 20 MG tablet TAKE 1 TABLET BY MOUTH TWICE A DAY 60 tablet 0   predniSONE (STERAPRED UNI-PAK 21 TAB) 10 MG (21) TBPK tablet Take by mouth daily. Take as directed. (Patient not taking: Reported on 11/04/2020) 21 tablet 0   No facility-administered medications prior to visit.    ROS: Review of Systems  Constitutional:  Negative for appetite change, fatigue and unexpected weight change.  HENT:  Negative for congestion, nosebleeds, sneezing, sore throat and trouble swallowing.   Eyes:  Negative for itching and visual disturbance.  Respiratory:  Negative for cough.   Cardiovascular:  Negative for chest pain, palpitations and leg swelling.  Gastrointestinal:  Negative for abdominal distention, blood in stool, diarrhea and nausea.  Genitourinary:  Negative for frequency and hematuria.  Musculoskeletal:  Negative for back pain, gait problem, joint swelling and neck pain.  Skin:  Negative for rash.  Neurological:  Negative for dizziness, tremors, speech difficulty and weakness.  Psychiatric/Behavioral:  Positive for decreased concentration. Negative for agitation, dysphoric mood and sleep disturbance. The patient is not nervous/anxious.    Objective:  BP 128/76 (BP Location: Left Arm)   Pulse 82   Temp 98.2 F (36.8 C) (Oral)   Ht 6\' 4"  (1.93 m)   Wt 276 lb 6.4 oz (125.4 kg)   SpO2 97%   BMI 33.64  kg/m   BP Readings from Last 3 Encounters:  11/04/20 128/76  11/06/19 139/74  07/31/19 120/74    Wt Readings from Last 3 Encounters:  11/04/20 276 lb 6.4 oz (125.4 kg)  07/31/19 (!) 311 lb (141.1 kg)  01/30/19 (!) 341 lb (154.7 kg)    Physical Exam Constitutional:      General: He is not in acute distress.    Appearance: He is well-developed.     Comments: NAD  Eyes:     Conjunctiva/sclera: Conjunctivae normal.     Pupils: Pupils are equal, round, and reactive to light.  Neck:     Thyroid: No thyromegaly.     Vascular: No JVD.  Cardiovascular:     Rate and Rhythm: Normal rate and regular rhythm.     Heart sounds: Normal heart sounds. No murmur heard.   No friction rub. No gallop.  Pulmonary:     Effort: Pulmonary effort is normal. No respiratory distress.     Breath sounds: Normal breath sounds. No wheezing or rales.  Chest:     Chest wall: No tenderness.  Abdominal:     General: Bowel sounds are normal. There is no distension.     Palpations: Abdomen is soft. There is no mass.     Tenderness: There is no abdominal tenderness. There is no guarding or rebound.  Musculoskeletal:        General: No tenderness. Normal range of motion.     Cervical back: Normal range of motion.  Lymphadenopathy:     Cervical: No cervical adenopathy.  Skin:    General: Skin is warm and dry.     Findings: No rash.  Neurological:     Mental Status: He is alert and oriented to person, place, and time.     Cranial Nerves: No cranial nerve deficit.     Motor: No abnormal muscle tone.     Coordination: Coordination normal.     Gait: Gait normal.     Deep Tendon Reflexes: Reflexes are normal and symmetric.  Psychiatric:        Behavior: Behavior normal.        Thought Content: Thought content normal.        Judgment: Judgment normal.  Rectal-declined  Lab Results  Component Value Date   WBC 5.7 07/31/2019   HGB 16.4 07/31/2019   HCT 48.3 07/31/2019   PLT 132.0 (L) 07/31/2019    GLUCOSE 97 07/31/2019   CHOL 181 07/31/2019   TRIG 47.0 07/31/2019   HDL 32.40 (L) 07/31/2019   LDLCALC 139 (H) 07/31/2019   ALT 24 07/31/2019   AST 23 07/31/2019   NA 139 07/31/2019   K 4.3 07/31/2019   CL 106 07/31/2019   CREATININE 1.07 07/31/2019   BUN 22 07/31/2019   CO2 23 07/31/2019   TSH 1.16 07/31/2019   HGBA1C 5.4 07/31/2019   MICROALBUR 0.7 01/21/2014    No results found.  Assessment & Plan:    Sonda Primes, MD

## 2020-11-04 NOTE — Assessment & Plan Note (Signed)

## 2020-11-04 NOTE — Assessment & Plan Note (Signed)
In wt management clinic w/W-F 2020. He was 389 lbs- on diet

## 2020-11-04 NOTE — Assessment & Plan Note (Signed)
On Lisinopril, Propranolol

## 2020-11-04 NOTE — Addendum Note (Signed)
Addended by: Madelon Lips on: 11/04/2020 03:38 PM   Modules accepted: Orders

## 2020-11-04 NOTE — Assessment & Plan Note (Signed)
Cont w/Citalopram 

## 2020-11-04 NOTE — Assessment & Plan Note (Signed)
Vit D 

## 2021-09-28 ENCOUNTER — Encounter: Payer: Self-pay | Admitting: Internal Medicine

## 2021-11-11 ENCOUNTER — Other Ambulatory Visit: Payer: Self-pay | Admitting: Internal Medicine

## 2021-12-17 ENCOUNTER — Telehealth: Payer: Self-pay | Admitting: Internal Medicine

## 2021-12-22 MED ORDER — CITALOPRAM HYDROBROMIDE 20 MG PO TABS: 20.0000 mg | ORAL_TABLET | Freq: Every day | ORAL | 0 refills | Status: DC

## 2021-12-22 MED ORDER — PROPRANOLOL HCL 20 MG PO TABS: 20.0000 mg | ORAL_TABLET | Freq: Two times a day (BID) | ORAL | 0 refills | Status: DC

## 2021-12-22 MED ORDER — LISINOPRIL 20 MG PO TABS: 20.0000 mg | ORAL_TABLET | Freq: Every day | ORAL | 0 refills | Status: DC

## 2021-12-22 NOTE — Addendum Note (Signed)
Addended by: Earnstine Regal on: 12/22/2021 12:51 PM   Modules accepted: Orders

## 2021-12-22 NOTE — Telephone Encounter (Signed)
Patient called and scheduled CPE for next available on 01/12/22, He wanted to know if he can get some medication sent in until then. Call back is [phone]

## 2021-12-22 NOTE — Telephone Encounter (Signed)
Notified pt 30 day script sent to [facility].Marland KitchenJohny Shaffer

## 2022-01-12 ENCOUNTER — Encounter: Payer: Self-pay | Admitting: Internal Medicine

## 2022-01-12 ENCOUNTER — Ambulatory Visit (INDEPENDENT_AMBULATORY_CARE_PROVIDER_SITE_OTHER): Payer: 59 | Admitting: Internal Medicine

## 2022-01-12 VITALS — BP 118/76 | HR 75 | Temp 97.8°F | Ht 76.0 in | Wt 302.2 lb

## 2022-01-12 DIAGNOSIS — I10 Essential (primary) hypertension: Secondary | ICD-10-CM

## 2022-01-12 DIAGNOSIS — Z125 Encounter for screening for malignant neoplasm of prostate: Secondary | ICD-10-CM

## 2022-01-12 DIAGNOSIS — Z0000 Encounter for general adult medical examination without abnormal findings: Secondary | ICD-10-CM

## 2022-01-12 DIAGNOSIS — F419 Anxiety disorder, unspecified: Secondary | ICD-10-CM | POA: Diagnosis not present

## 2022-01-12 DIAGNOSIS — Z6836 Body mass index (BMI) 36.0-36.9, adult: Secondary | ICD-10-CM

## 2022-01-12 LAB — URINALYSIS
Bilirubin Urine: NEGATIVE
Hgb urine dipstick: NEGATIVE
Ketones, ur: NEGATIVE
Leukocytes,Ua: NEGATIVE
Nitrite: NEGATIVE
Specific Gravity, Urine: 1.025 (ref 1.000–1.030)
Total Protein, Urine: NEGATIVE
Urine Glucose: NEGATIVE
Urobilinogen, UA: 0.2 (ref 0.0–1.0)
pH: 5.5 (ref 5.0–8.0)

## 2022-01-12 LAB — CBC WITH DIFFERENTIAL/PLATELET
Basophils Absolute: 0 10*3/uL (ref 0.0–0.1)
Basophils Relative: 0.8 % (ref 0.0–3.0)
Eosinophils Absolute: 0.2 10*3/uL (ref 0.0–0.7)
Eosinophils Relative: 3.4 % (ref 0.0–5.0)
HCT: 46.3 % (ref 39.0–52.0)
Hemoglobin: 15.7 g/dL (ref 13.0–17.0)
Lymphocytes Relative: 29.1 % (ref 12.0–46.0)
Lymphs Abs: 1.8 10*3/uL (ref 0.7–4.0)
MCHC: 33.9 g/dL (ref 30.0–36.0)
MCV: 91.2 fl (ref 78.0–100.0)
Monocytes Absolute: 0.6 10*3/uL (ref 0.1–1.0)
Monocytes Relative: 9.1 % (ref 3.0–12.0)
Neutro Abs: 3.5 10*3/uL (ref 1.4–7.7)
Neutrophils Relative %: 57.6 % (ref 43.0–77.0)
Platelets: 149 10*3/uL — ABNORMAL LOW (ref 150.0–400.0)
RBC: 5.07 Mil/uL (ref 4.22–5.81)
RDW: 12.8 % (ref 11.5–15.5)
WBC: 6.1 10*3/uL (ref 4.0–10.5)

## 2022-01-12 LAB — LIPID PANEL
Cholesterol: 228 mg/dL — ABNORMAL HIGH (ref 0–200)
HDL: 33.1 mg/dL — ABNORMAL LOW (ref >39.00)
LDL Cholesterol: 171 mg/dL — ABNORMAL HIGH (ref 0–99)
NonHDL: 195.01
Total CHOL/HDL Ratio: 7
Triglycerides: 118 mg/dL (ref 0.0–149.0)
VLDL: 23.6 mg/dL (ref 0.0–40.0)

## 2022-01-12 LAB — COMPREHENSIVE METABOLIC PANEL
ALT: 19 U/L (ref 0–53)
AST: 20 U/L (ref 0–37)
Albumin: 4.2 g/dL (ref 3.5–5.2)
Alkaline Phosphatase: 99 U/L (ref 39–117)
BUN: 14 mg/dL (ref 6–23)
CO2: 25 mEq/L (ref 19–32)
Calcium: 9.2 mg/dL (ref 8.4–10.5)
Chloride: 104 mEq/L (ref 96–112)
Creatinine, Ser: 1.05 mg/dL (ref 0.40–1.50)
GFR: 87.76 mL/min (ref >60.00)
Glucose, Bld: 85 mg/dL (ref 70–99)
Potassium: 4.2 mEq/L (ref 3.5–5.1)
Sodium: 139 mEq/L (ref 135–145)
Total Bilirubin: 0.7 mg/dL (ref 0.2–1.2)
Total Protein: 7.5 g/dL (ref 6.0–8.3)

## 2022-01-12 LAB — PSA: PSA: 0.18 ng/mL (ref 0.10–4.00)

## 2022-01-12 LAB — TSH: TSH: 0.89 u[IU]/mL (ref 0.35–5.50)

## 2022-01-12 MED ORDER — WEGOVY 0.25 MG/0.5ML ~~LOC~~ SOAJ: 0.2500 mg | SUBCUTANEOUS | 2 refills | Status: DC

## 2022-01-12 MED ORDER — LISINOPRIL 20 MG PO TABS: 20.0000 mg | ORAL_TABLET | Freq: Every day | ORAL | 3 refills | Status: DC

## 2022-01-12 MED ORDER — CITALOPRAM HYDROBROMIDE 20 MG PO TABS: 20.0000 mg | ORAL_TABLET | Freq: Every day | ORAL | 3 refills | Status: DC

## 2022-01-12 MED ORDER — PROPRANOLOL HCL 20 MG PO TABS: 20.0000 mg | ORAL_TABLET | Freq: Two times a day (BID) | ORAL | 3 refills | Status: DC

## 2022-01-12 NOTE — Assessment & Plan Note (Signed)
Cont on Lisinopril, Propranolol

## 2022-01-12 NOTE — Assessment & Plan Note (Signed)
Cont w/Citalopram 

## 2022-01-12 NOTE — Progress Notes (Signed)
Subjective:  Patient ID: Frank Shaffer, male    DOB: 1980/08/05  Age: 41 y.o. MRN: [id number]  CC: Annual Exam (Need med refills)   HPI Frank Shaffer presents for a well exam  Outpatient Medications Prior to Visit  Medication Sig Dispense Refill   Cholecalciferol (VITAMIN D3) 50 MCG (2000 UT) TABS Take by mouth.     citalopram (CELEXA) 20 MG tablet Take 1 tablet (20 mg total) by mouth daily. Must keep appt for future refills 30 tablet 0   lisinopril (ZESTRIL) 20 MG tablet Take 1 tablet (20 mg total) by mouth daily. Must keep appt for future refills 30 tablet 0   propranolol (INDERAL) 20 MG tablet Take 1 tablet (20 mg total) by mouth 2 (two) times daily. Must keep appt for future refills 60 tablet 0   No facility-administered medications prior to visit.    ROS: Review of Systems  Constitutional:  Negative for appetite change, fatigue and unexpected weight change.  HENT:  Negative for congestion, nosebleeds, sneezing, sore throat and trouble swallowing.   Eyes:  Negative for itching and visual disturbance.  Respiratory:  Negative for cough.   Cardiovascular:  Negative for chest pain, palpitations and leg swelling.  Gastrointestinal:  Negative for abdominal distention, blood in stool, diarrhea and nausea.  Genitourinary:  Negative for frequency and hematuria.  Musculoskeletal:  Negative for back pain, gait problem, joint swelling and neck pain.  Skin:  Negative for rash.  Neurological:  Negative for dizziness, tremors, speech difficulty and weakness.  Psychiatric/Behavioral:  Negative for agitation, dysphoric mood and sleep disturbance. The patient is not nervous/anxious.     Objective:  BP 118/76 (BP Location: Left Arm)   Pulse 75   Temp 97.8 F (36.6 C) (Oral)   Ht 6\' 4"  (1.93 m)   Wt (!) 302 lb 3.2 oz (137.1 kg)   SpO2 96%   BMI 36.78 kg/m   BP Readings from Last 3 Encounters:  01/12/22 118/76  11/04/20 128/76  11/06/19 139/74    Wt Readings from Last 3  Encounters:  01/12/22 (!) 302 lb 3.2 oz (137.1 kg)  11/04/20 276 lb 6.4 oz (125.4 kg)  07/31/19 (!) 311 lb (141.1 kg)    Physical Exam Constitutional:      General: He is not in acute distress.    Appearance: He is well-developed. He is obese.     Comments: NAD  Eyes:     Conjunctiva/sclera: Conjunctivae normal.     Pupils: Pupils are equal, round, and reactive to light.  Neck:     Thyroid: No thyromegaly.     Vascular: No JVD.  Cardiovascular:     Rate and Rhythm: Normal rate and regular rhythm.     Heart sounds: Normal heart sounds. No murmur heard.    No friction rub. No gallop.  Pulmonary:     Effort: Pulmonary effort is normal. No respiratory distress.     Breath sounds: Normal breath sounds. No wheezing or rales.  Chest:     Chest wall: No tenderness.  Abdominal:     General: Bowel sounds are normal. There is no distension.     Palpations: Abdomen is soft. There is no mass.     Tenderness: There is no abdominal tenderness. There is no guarding or rebound.  Musculoskeletal:        General: No tenderness. Normal range of motion.     Cervical back: Normal range of motion.  Lymphadenopathy:     Cervical: No cervical  adenopathy.  Skin:    General: Skin is warm and dry.     Findings: No rash.  Neurological:     Mental Status: He is alert and oriented to person, place, and time.     Cranial Nerves: No cranial nerve deficit.     Motor: No abnormal muscle tone.     Coordination: Coordination normal.     Gait: Gait normal.     Deep Tendon Reflexes: Reflexes are normal and symmetric.  Psychiatric:        Behavior: Behavior normal.        Thought Content: Thought content normal.        Judgment: Judgment normal.     Lab Results  Component Value Date   WBC 6.4 11/04/2020   HGB 15.1 11/04/2020   HCT 45.2 11/04/2020   PLT 129.0 (L) 11/04/2020   GLUCOSE 84 11/04/2020   CHOL 167 11/04/2020   TRIG 57.0 11/04/2020   HDL 35.80 (L) 11/04/2020   LDLCALC 119 (H)  11/04/2020   ALT 20 11/04/2020   AST 22 11/04/2020   NA 142 11/04/2020   K 4.1 11/04/2020   CL 106 11/04/2020   CREATININE 1.07 11/04/2020   BUN 11 11/04/2020   CO2 27 11/04/2020   TSH 1.05 11/04/2020   PSA 0.29 11/04/2020   HGBA1C 5.4 07/31/2019   MICROALBUR 0.7 01/21/2014    No results found.  Assessment & Plan:   Problem List Items Addressed This Visit     Anxiety disorder    Cont w/Citalopram      Relevant Medications   citalopram (CELEXA) 20 MG tablet   Hypertension    Cont on Lisinopril, Propranolol      Relevant Medications   lisinopril (ZESTRIL) 20 MG tablet   propranolol (INDERAL) 20 MG tablet   Obese    BMI 36 Start Wegovy      Relevant Medications   Semaglutide-Weight Management (WEGOVY) 0.25 MG/0.5ML SOAJ   Well adult exam    We discussed age appropriate health related issues, including available/recomended screening tests and vaccinations. Labs were ordered to be later reviewed . All questions were answered. We discussed one or more of the following - seat belt use, use of sunscreen/sun exposure exercise, safe sex, fall risk reduction, second hand smoke exposure, firearm use and storage, seat belt use, a need for adhering to healthy diet and exercise. Labs were ordered .  All questions were answered. Lost wt on diet.         Meds ordered this encounter  Medications   Semaglutide-Weight Management (WEGOVY) 0.25 MG/0.5ML SOAJ    Sig: Inject 0.25 mg into the skin once a week.    Dispense:  2 mL    Refill:  2    BMI 36, HTN   citalopram (CELEXA) 20 MG tablet    Sig: Take 1 tablet (20 mg total) by mouth daily. Must keep appt for future refills    Dispense:  90 tablet    Refill:  3   lisinopril (ZESTRIL) 20 MG tablet    Sig: Take 1 tablet (20 mg total) by mouth daily. Must keep appt for future refills    Dispense:  90 tablet    Refill:  3   propranolol (INDERAL) 20 MG tablet    Sig: Take 1 tablet (20 mg total) by mouth 2 (two) times daily. Must  keep appt for future refills    Dispense:  180 tablet    Refill:  3  Follow-up: No follow-ups on file.  Walker Kehr, MD

## 2022-01-12 NOTE — Assessment & Plan Note (Signed)
We discussed age appropriate health related issues, including available/recomended screening tests and vaccinations. Labs were ordered to be later reviewed . All questions were answered. We discussed one or more of the following - seat belt use, use of sunscreen/sun exposure exercise, safe sex, fall risk reduction, second hand smoke exposure, firearm use and storage, seat belt use, a need for adhering to healthy diet and exercise. Labs were ordered .  All questions were answered. Lost wt on diet.

## 2022-01-12 NOTE — Assessment & Plan Note (Signed)
BMI 36 Start [employer]

## 2022-01-14 ENCOUNTER — Telehealth: Payer: Self-pay | Admitting: *Deleted

## 2022-01-14 NOTE — Telephone Encounter (Signed)
Rec'd determination med was APPROVED. It states Request Reference Number: [id number]. WEGOVY INJ 0.25MG  is approved through 05/17/2022. Faxing approval to [facility].Marland KitchenJohny Chess

## 2022-01-14 NOTE — Telephone Encounter (Signed)
Rec'd fax pt needing PA on Wegovy. Submitted w/  (Key: [id number]) Rec'd msg Your information has been sent to [facility]...Johny Chess

## 2022-01-16 ENCOUNTER — Encounter: Payer: Self-pay | Admitting: Internal Medicine

## 2022-01-17 ENCOUNTER — Other Ambulatory Visit (HOSPITAL_COMMUNITY): Payer: Self-pay

## 2022-01-17 MED ORDER — WEGOVY 0.25 MG/0.5ML ~~LOC~~ SOAJ
0.2500 mg | SUBCUTANEOUS | 2 refills | Status: DC
  Filled 2022-01-17: qty 2, 28d supply, fill #0

## 2022-01-18 ENCOUNTER — Other Ambulatory Visit (HOSPITAL_COMMUNITY): Payer: Self-pay

## 2022-01-20 ENCOUNTER — Other Ambulatory Visit (HOSPITAL_COMMUNITY): Payer: Self-pay

## 2022-02-28 ENCOUNTER — Other Ambulatory Visit (HOSPITAL_COMMUNITY): Payer: Self-pay

## 2022-03-04 ENCOUNTER — Other Ambulatory Visit (HOSPITAL_COMMUNITY): Payer: Self-pay

## 2022-04-01 ENCOUNTER — Other Ambulatory Visit (HOSPITAL_COMMUNITY): Payer: Self-pay

## 2022-04-05 ENCOUNTER — Encounter: Payer: Self-pay | Admitting: Internal Medicine

## 2022-04-11 ENCOUNTER — Other Ambulatory Visit: Payer: Self-pay | Admitting: Internal Medicine

## 2022-04-11 MED ORDER — SAXENDA 18 MG/3ML ~~LOC~~ SOPN: 0.6000 mg | PEN_INJECTOR | Freq: Every day | SUBCUTANEOUS | 1 refills | Status: DC

## 2022-04-14 ENCOUNTER — Other Ambulatory Visit (HOSPITAL_COMMUNITY): Payer: Self-pay

## 2022-04-14 MED ORDER — SAXENDA 18 MG/3ML ~~LOC~~ SOPN
0.6000 mg | PEN_INJECTOR | Freq: Every day | SUBCUTANEOUS | 1 refills | Status: DC
  Filled 2022-04-14: qty 3, 30d supply, fill #0

## 2022-04-14 NOTE — Telephone Encounter (Signed)
This was supposed to go to [facility]. Can it please be sent there instead

## 2022-04-14 NOTE — Telephone Encounter (Signed)
Resent rx to [facility].Frank KitchenJohny Shaffer

## 2022-04-14 NOTE — Addendum Note (Signed)
Addended by: Earnstine Regal on: 04/14/2022 04:21 PM   Modules accepted: Orders

## 2022-04-19 ENCOUNTER — Other Ambulatory Visit (HOSPITAL_COMMUNITY): Payer: Self-pay

## 2022-04-20 ENCOUNTER — Other Ambulatory Visit (HOSPITAL_COMMUNITY): Payer: Self-pay

## 2022-04-20 NOTE — Telephone Encounter (Signed)
Pt need a prior authorization on the Saxenda. Forwarding msg to PA team.../llmb

## 2022-04-22 ENCOUNTER — Other Ambulatory Visit (HOSPITAL_COMMUNITY): Payer: Self-pay

## 2022-04-22 NOTE — Telephone Encounter (Signed)
Patient Advocate Encounter   Received notification from [facility] that prior authorization for Saxenda is required.   PA submitted on 04/22/2022 Key [id number] Status is pending

## 2022-04-23 ENCOUNTER — Other Ambulatory Visit (HOSPITAL_COMMUNITY): Payer: Self-pay

## 2022-04-25 ENCOUNTER — Other Ambulatory Visit (HOSPITAL_COMMUNITY): Payer: Self-pay

## 2022-04-25 NOTE — Telephone Encounter (Signed)
Pharmacy Patient Advocate Encounter  Prior Authorization for Frank Shaffer has been approved.    PA# [id number] Effective dates: 04/22/2022 through 08/21/2022

## 2022-05-11 ENCOUNTER — Ambulatory Visit: Payer: 59 | Admitting: Internal Medicine

## 2022-05-11 ENCOUNTER — Other Ambulatory Visit (HOSPITAL_COMMUNITY): Payer: Self-pay

## 2022-05-11 ENCOUNTER — Encounter: Payer: Self-pay | Admitting: Internal Medicine

## 2022-05-11 VITALS — BP 130/78 | HR 85 | Temp 97.9°F | Ht 76.0 in | Wt 303.0 lb

## 2022-05-11 DIAGNOSIS — Z6836 Body mass index (BMI) 36.0-36.9, adult: Secondary | ICD-10-CM

## 2022-05-11 DIAGNOSIS — R7303 Prediabetes: Secondary | ICD-10-CM | POA: Diagnosis not present

## 2022-05-11 MED ORDER — SAXENDA 18 MG/3ML ~~LOC~~ SOPN
3.0000 mg | PEN_INJECTOR | Freq: Every day | SUBCUTANEOUS | 5 refills | Status: DC
  Filled 2022-05-11: qty 3, 6d supply, fill #0
  Filled 2022-05-12: qty 15, 30d supply, fill #0
  Filled 2022-06-12: qty 15, 30d supply, fill #1
  Filled 2022-07-09: qty 15, 30d supply, fill #2
  Filled 2022-08-15: qty 15, 30d supply, fill #3
  Filled 2022-09-13 – 2022-11-02 (×3): qty 15, 30d supply, fill #4
  Filled 2022-12-02: qty 15, 30d supply, fill #5

## 2022-05-11 NOTE — Progress Notes (Signed)
Subjective:  Patient ID: Frank Shaffer, male    DOB: January 14, 1981  Age: 42 y.o. MRN: [id number]  CC: Medication Management   HPI Frank Shaffer presents for obesity f/u  - on Saxenda Obesity  Outpatient Medications Prior to Visit  Medication Sig Dispense Refill   Cholecalciferol (VITAMIN D3) 50 MCG (2000 UT) TABS Take by mouth.     citalopram (CELEXA) 20 MG tablet Take 1 tablet (20 mg total) by mouth daily. Must keep appt for future refills 90 tablet 3   Liraglutide -Weight Management (SAXENDA) 18 MG/3ML SOPN Inject 0.6 mg into the skin daily. 3 mL 1   lisinopril (ZESTRIL) 20 MG tablet Take 1 tablet (20 mg total) by mouth daily. Must keep appt for future refills 90 tablet 3   propranolol (INDERAL) 20 MG tablet Take 1 tablet (20 mg total) by mouth 2 (two) times daily. Must keep appt for future refills 180 tablet 3   No facility-administered medications prior to visit.    ROS: Review of Systems  Constitutional:  Negative for appetite change, fatigue and unexpected weight change.  HENT:  Negative for congestion, nosebleeds, sneezing, sore throat and trouble swallowing.   Eyes:  Negative for itching and visual disturbance.  Respiratory:  Negative for cough.   Cardiovascular:  Negative for chest pain, palpitations and leg swelling.  Gastrointestinal:  Negative for abdominal distention, blood in stool, diarrhea and nausea.  Genitourinary:  Negative for frequency and hematuria.  Musculoskeletal:  Negative for back pain, gait problem, joint swelling and neck pain.  Skin:  Negative for rash.  Neurological:  Negative for dizziness, tremors, speech difficulty and weakness.  Psychiatric/Behavioral:  Negative for agitation, dysphoric mood and sleep disturbance. The patient is not nervous/anxious.     Objective:  BP 130/78 (BP Location: Right Arm, Patient Position: Sitting, Cuff Size: Normal)   Pulse 85   Temp 97.9 F (36.6 C) (Oral)   Ht 6\' 4"  (1.93 m)   Wt (!) 303 lb (137.4 kg)    SpO2 96%   BMI 36.88 kg/m   BP Readings from Last 3 Encounters:  05/11/22 130/78  01/12/22 118/76  11/04/20 128/76    Wt Readings from Last 3 Encounters:  05/11/22 (!) 303 lb (137.4 kg)  01/12/22 (!) 302 lb 3.2 oz (137.1 kg)  11/04/20 276 lb 6.4 oz (125.4 kg)    Physical Exam Constitutional:      General: He is not in acute distress.    Appearance: He is well-developed. He is obese.     Comments: NAD  Eyes:     Conjunctiva/sclera: Conjunctivae normal.     Pupils: Pupils are equal, round, and reactive to light.  Neck:     Thyroid: No thyromegaly.     Vascular: No JVD.  Cardiovascular:     Rate and Rhythm: Normal rate and regular rhythm.     Heart sounds: Normal heart sounds. No murmur heard.    No friction rub. No gallop.  Pulmonary:     Effort: Pulmonary effort is normal. No respiratory distress.     Breath sounds: Normal breath sounds. No wheezing or rales.  Chest:     Chest wall: No tenderness.  Abdominal:     General: Bowel sounds are normal. There is no distension.     Palpations: Abdomen is soft. There is no mass.     Tenderness: There is no abdominal tenderness. There is no guarding or rebound.  Musculoskeletal:        General: No  tenderness. Normal range of motion.     Cervical back: Normal range of motion.  Lymphadenopathy:     Cervical: No cervical adenopathy.  Skin:    General: Skin is warm and dry.     Findings: No rash.  Neurological:     Mental Status: He is alert and oriented to person, place, and time.     Cranial Nerves: No cranial nerve deficit.     Motor: No abnormal muscle tone.     Coordination: Coordination normal.     Gait: Gait normal.     Deep Tendon Reflexes: Reflexes are normal and symmetric.  Psychiatric:        Behavior: Behavior normal.        Thought Content: Thought content normal.        Judgment: Judgment normal.     Lab Results  Component Value Date   WBC 6.1 01/12/2022   HGB 15.7 01/12/2022   HCT 46.3 01/12/2022    PLT 149.0 (L) 01/12/2022   GLUCOSE 85 01/12/2022   CHOL 228 (H) 01/12/2022   TRIG 118.0 01/12/2022   HDL 33.10 (L) 01/12/2022   LDLCALC 171 (H) 01/12/2022   ALT 19 01/12/2022   AST 20 01/12/2022   NA 139 01/12/2022   K 4.2 01/12/2022   CL 104 01/12/2022   CREATININE 1.05 01/12/2022   BUN 14 01/12/2022   CO2 25 01/12/2022   TSH 0.89 01/12/2022   PSA 0.18 01/12/2022   HGBA1C 5.4 07/31/2019   MICROALBUR 0.7 01/21/2014    No results found.  Assessment & Plan:   Problem List Items Addressed This Visit   None     No orders of the defined types were placed in this encounter.     Follow-up: No follow-ups on file.  Walker Kehr, MD

## 2022-05-11 NOTE — Assessment & Plan Note (Signed)
Use Saxenda 1.2 mg/d x 2 weeks, then 1.8 mg/d x 2 weeks, then 2.4 mg/d x 4 wks, then 3 mg/d  

## 2022-05-11 NOTE — Patient Instructions (Signed)
Use Saxenda 1.2 mg/d x 2 weeks, then 1.8 mg/d x 2 weeks, then 2.4 mg/d x 4 wks, then 3 mg/d

## 2022-05-12 ENCOUNTER — Telehealth: Payer: Self-pay | Admitting: *Deleted

## 2022-05-12 ENCOUNTER — Other Ambulatory Visit (HOSPITAL_COMMUNITY): Payer: Self-pay

## 2022-05-12 ENCOUNTER — Other Ambulatory Visit: Payer: Self-pay

## 2022-05-12 NOTE — Telephone Encounter (Signed)
Pt need PA on his Saxenda. Submitted w/ (Key: [id number])  information has been sent to [facility]...Johny Chess

## 2022-05-12 NOTE — Telephone Encounter (Signed)
Rec'd msg stating This medication or product was previously approved on [id number] from 2022-04-22 to 2022-08-21.Marland KitchenJohny Shaffer

## 2022-06-06 ENCOUNTER — Other Ambulatory Visit (HOSPITAL_COMMUNITY): Payer: Self-pay

## 2022-06-13 ENCOUNTER — Other Ambulatory Visit (HOSPITAL_COMMUNITY): Payer: Self-pay

## 2022-07-14 ENCOUNTER — Other Ambulatory Visit (HOSPITAL_COMMUNITY): Payer: Self-pay

## 2022-10-13 ENCOUNTER — Other Ambulatory Visit (HOSPITAL_COMMUNITY): Payer: Self-pay

## 2022-10-14 ENCOUNTER — Other Ambulatory Visit (HOSPITAL_COMMUNITY): Payer: Self-pay

## 2022-10-17 ENCOUNTER — Other Ambulatory Visit (HOSPITAL_COMMUNITY): Payer: Self-pay

## 2022-10-31 ENCOUNTER — Encounter: Payer: Self-pay | Admitting: Internal Medicine

## 2022-10-31 ENCOUNTER — Other Ambulatory Visit (HOSPITAL_COMMUNITY): Payer: Self-pay

## 2022-10-31 NOTE — Telephone Encounter (Signed)
Pt need PA on Saxenda....Raechel Chute

## 2022-10-31 NOTE — Telephone Encounter (Signed)
Wt Readings from Last 3 Encounters:  05/11/22 (!) 303 lb (137.4 kg)  01/12/22 (!) 302 lb 3.2 oz (137.1 kg)  11/04/20 276 lb 6.4 oz (125.4 kg)

## 2022-11-01 ENCOUNTER — Telehealth: Payer: Self-pay

## 2022-11-01 NOTE — Telephone Encounter (Signed)
Pharmacy Patient Advocate Encounter   Received notification from Patient Advice Request messages that prior authorization for Saxenda 18MG /3ML pen-injectors is required/requested.   Insurance verification completed.   The patient is insured through [facility] .   Per test claim: PA required; PA submitted to [facility] via CoverMyMeds Key/confirmation #/EOC [id number] Status is pending

## 2022-11-02 ENCOUNTER — Other Ambulatory Visit (HOSPITAL_COMMUNITY): Payer: Self-pay

## 2022-11-02 MED ORDER — TECHLITE PLUS PEN NEEDLES 32G X 4 MM MISC
1 refills | Status: AC
  Filled 2022-11-02: qty 100, 31d supply, fill #0
  Filled 2022-12-02: qty 100, 31d supply, fill #1

## 2022-11-02 NOTE — Telephone Encounter (Signed)
Sent msg to to via [place] on PA.Marland KitchenRaechel Chute

## 2022-11-02 NOTE — Telephone Encounter (Signed)
Pharmacy Patient Advocate Encounter  Received notification from [facility] that Prior Authorization for Saxenda 18MG /3ML pen-injectors has been APPROVED from 11/01/22 to 11/01/23. Ran test claim, Copay is $50  PA #/Case ID/Reference #: [id number]

## 2022-12-07 ENCOUNTER — Other Ambulatory Visit (HOSPITAL_COMMUNITY): Payer: Self-pay

## 2023-01-08 ENCOUNTER — Other Ambulatory Visit: Payer: Self-pay | Admitting: Internal Medicine

## 2023-01-09 ENCOUNTER — Encounter: Payer: Self-pay | Admitting: Internal Medicine

## 2023-01-10 ENCOUNTER — Other Ambulatory Visit (HOSPITAL_COMMUNITY): Payer: Self-pay

## 2023-01-10 ENCOUNTER — Other Ambulatory Visit: Payer: Self-pay

## 2023-01-10 MED ORDER — SAXENDA 18 MG/3ML ~~LOC~~ SOPN
3.0000 mg | PEN_INJECTOR | Freq: Every day | SUBCUTANEOUS | 0 refills | Status: DC
  Filled 2023-01-10: qty 15, 30d supply, fill #0

## 2023-01-19 ENCOUNTER — Other Ambulatory Visit: Payer: Self-pay | Admitting: Internal Medicine

## 2023-01-19 ENCOUNTER — Other Ambulatory Visit (HOSPITAL_COMMUNITY): Payer: Self-pay

## 2023-01-27 ENCOUNTER — Ambulatory Visit: Payer: 59 | Admitting: Internal Medicine

## 2023-01-27 ENCOUNTER — Encounter: Payer: Self-pay | Admitting: Internal Medicine

## 2023-01-27 VITALS — BP 110/72 | HR 71 | Temp 98.0°F | Ht 76.0 in | Wt 264.0 lb

## 2023-01-27 DIAGNOSIS — Z23 Encounter for immunization: Secondary | ICD-10-CM

## 2023-01-27 DIAGNOSIS — I10 Essential (primary) hypertension: Secondary | ICD-10-CM | POA: Diagnosis not present

## 2023-01-27 DIAGNOSIS — Z0000 Encounter for general adult medical examination without abnormal findings: Secondary | ICD-10-CM

## 2023-01-27 DIAGNOSIS — Z125 Encounter for screening for malignant neoplasm of prostate: Secondary | ICD-10-CM

## 2023-01-27 DIAGNOSIS — Z13220 Encounter for screening for lipoid disorders: Secondary | ICD-10-CM

## 2023-01-27 LAB — CBC WITH DIFFERENTIAL/PLATELET
Basophils Absolute: 0 10*3/uL (ref 0.0–0.1)
Basophils Relative: 0.6 % (ref 0.0–3.0)
Eosinophils Absolute: 0.1 10*3/uL (ref 0.0–0.7)
Eosinophils Relative: 1.9 % (ref 0.0–5.0)
HCT: 46.8 % (ref 39.0–52.0)
Hemoglobin: 15.5 g/dL (ref 13.0–17.0)
Lymphocytes Relative: 30.6 % (ref 12.0–46.0)
Lymphs Abs: 1.6 10*3/uL (ref 0.7–4.0)
MCHC: 33 g/dL (ref 30.0–36.0)
MCV: 92.1 fL (ref 78.0–100.0)
Monocytes Absolute: 0.3 10*3/uL (ref 0.1–1.0)
Monocytes Relative: 6.4 % (ref 3.0–12.0)
Neutro Abs: 3.2 10*3/uL (ref 1.4–7.7)
Neutrophils Relative %: 60.5 % (ref 43.0–77.0)
Platelets: 159 10*3/uL (ref 150.0–400.0)
RBC: 5.08 Mil/uL (ref 4.22–5.81)
RDW: 12.7 % (ref 11.5–15.5)
WBC: 5.3 10*3/uL (ref 4.0–10.5)

## 2023-01-27 LAB — COMPREHENSIVE METABOLIC PANEL
ALT: 15 U/L (ref 0–53)
AST: 16 U/L (ref 0–37)
Albumin: 4.2 g/dL (ref 3.5–5.2)
Alkaline Phosphatase: 114 U/L (ref 39–117)
BUN: 12 mg/dL (ref 6–23)
CO2: 25 meq/L (ref 19–32)
Calcium: 9.2 mg/dL (ref 8.4–10.5)
Chloride: 106 meq/L (ref 96–112)
Creatinine, Ser: 1.04 mg/dL (ref 0.40–1.50)
GFR: 88.13 mL/min (ref >60.00)
Glucose, Bld: 74 mg/dL (ref 70–99)
Potassium: 4.3 meq/L (ref 3.5–5.1)
Sodium: 141 meq/L (ref 135–145)
Total Bilirubin: 0.6 mg/dL (ref 0.2–1.2)
Total Protein: 7.1 g/dL (ref 6.0–8.3)

## 2023-01-27 LAB — TSH: TSH: 0.72 u[IU]/mL (ref 0.35–5.50)

## 2023-01-27 LAB — URINALYSIS
Bilirubin Urine: NEGATIVE
Hgb urine dipstick: NEGATIVE
Ketones, ur: NEGATIVE
Leukocytes,Ua: NEGATIVE
Nitrite: NEGATIVE
Specific Gravity, Urine: >=1.03 — AB (ref 1.000–1.030)
Total Protein, Urine: NEGATIVE
Urine Glucose: NEGATIVE
Urobilinogen, UA: 1 (ref 0.0–1.0)
pH: 6 (ref 5.0–8.0)

## 2023-01-27 LAB — LIPID PANEL
Cholesterol: 194 mg/dL (ref 0–200)
HDL: 32.6 mg/dL — ABNORMAL LOW (ref >39.00)
LDL Cholesterol: 152 mg/dL — ABNORMAL HIGH (ref 0–99)
NonHDL: 161.01
Total CHOL/HDL Ratio: 6
Triglycerides: 46 mg/dL (ref 0.0–149.0)
VLDL: 9.2 mg/dL (ref 0.0–40.0)

## 2023-01-27 LAB — PSA: PSA: 0.3 ng/mL (ref 0.10–4.00)

## 2023-01-27 MED ORDER — LISINOPRIL 20 MG PO TABS: ORAL_TABLET | ORAL | Status: DC

## 2023-01-27 MED ORDER — SAXENDA 18 MG/3ML ~~LOC~~ SOPN: 3.0000 mg | PEN_INJECTOR | Freq: Every day | SUBCUTANEOUS | 5 refills | Status: DC

## 2023-01-27 NOTE — Assessment & Plan Note (Signed)
Frank Shaffer lost weight.  Blood pressure is low.  Reduce lisinopril to half tablet daily (10 mg) if lightheaded

## 2023-01-27 NOTE — Assessment & Plan Note (Addendum)
We discussed age appropriate health related issues, including available/recomended screening tests and vaccinations. Labs were ordered to be later reviewed . All questions were answered. We discussed one or more of the following - seat belt use, use of sunscreen/sun exposure exercise, safe sex, fall risk reduction, second hand smoke exposure, firearm use and storage, seat belt use, a need for adhering to healthy diet and exercise. Labs were ordered .  All questions were answered. Lost wt on diet and Saxenda Family history of coronary disease.  Coronary calcium CT scan suggested - will schedule

## 2023-01-27 NOTE — Progress Notes (Signed)
Subjective:  Patient ID: Frank Shaffer, male    DOB: Nov 11, 1980  Age: 42 y.o. MRN: [id number]  CC: Medical Management of Chronic Issues (F/u appt, no concerns )   HPI Frank Shaffer presents for obesity treatment with Saxenda follow-up F/u HTN Well exam  Outpatient Medications Prior to Visit  Medication Sig Dispense Refill   Cholecalciferol (VITAMIN D3) 50 MCG (2000 UT) TABS Take by mouth.     citalopram (CELEXA) 20 MG tablet Take 1 tablet (20 mg total) by mouth daily. Must keep appt for future refills 90 tablet 3   Insulin Pen Needle (TECHLITE PLUS PEN NEEDLES) 32G X 4 MM MISC qd 100 each 1   propranolol (INDERAL) 20 MG tablet Take 1 tablet (20 mg total) by mouth 2 (two) times daily. Must keep appt for future refills 180 tablet 3   Liraglutide -Weight Management (SAXENDA) 18 MG/3ML SOPN Inject 1.2 mg into the skin daily for 2 weeks, then 1.8 mg for 2 weeks, then 2.4 mg for 4 weeks, then 3 mg daily thereafter 15 mL 0   lisinopril (ZESTRIL) 20 MG tablet Take 1 tablet (20 mg total) by mouth daily. Must keep appt for future refills 90 tablet 3   No facility-administered medications prior to visit.    ROS: Review of Systems  Constitutional:  Negative for appetite change, fatigue and unexpected weight change.  HENT:  Negative for congestion, nosebleeds, sneezing, sore throat and trouble swallowing.   Eyes:  Negative for itching and visual disturbance.  Respiratory:  Negative for cough.   Cardiovascular:  Negative for chest pain, palpitations and leg swelling.  Gastrointestinal:  Negative for abdominal distention, blood in stool, diarrhea and nausea.  Genitourinary:  Negative for frequency and hematuria.  Musculoskeletal:  Negative for back pain, gait problem, joint swelling and neck pain.  Skin:  Negative for rash.  Neurological:  Negative for dizziness, tremors, speech difficulty and weakness.  Psychiatric/Behavioral:  Negative for agitation, dysphoric mood and sleep  disturbance. The patient is not nervous/anxious.     Objective:  BP 110/72   Pulse 71   Temp 98 F (36.7 C) (Oral)   Ht 6\' 4"  (1.93 m)   Wt 264 lb (119.7 kg)   SpO2 97%   BMI 32.14 kg/m   BP Readings from Last 3 Encounters:  01/27/23 110/72  05/11/22 130/78  01/12/22 118/76    Wt Readings from Last 3 Encounters:  01/27/23 264 lb (119.7 kg)  05/11/22 (!) 303 lb (137.4 kg)  01/12/22 (!) 302 lb 3.2 oz (137.1 kg)    Physical Exam Constitutional:      General: He is not in acute distress.    Appearance: He is well-developed. He is obese.     Comments: NAD  Eyes:     Conjunctiva/sclera: Conjunctivae normal.     Pupils: Pupils are equal, round, and reactive to light.  Neck:     Thyroid: No thyromegaly.     Vascular: No JVD.  Cardiovascular:     Rate and Rhythm: Normal rate and regular rhythm.     Heart sounds: Normal heart sounds. No murmur heard.    No friction rub. No gallop.  Pulmonary:     Effort: Pulmonary effort is normal. No respiratory distress.     Breath sounds: Normal breath sounds. No wheezing or rales.  Chest:     Chest wall: No tenderness.  Abdominal:     General: Bowel sounds are normal. There is no distension.  Palpations: Abdomen is soft. There is no mass.     Tenderness: There is no abdominal tenderness. There is no guarding or rebound.  Musculoskeletal:        General: No tenderness. Normal range of motion.     Cervical back: Normal range of motion.  Lymphadenopathy:     Cervical: No cervical adenopathy.  Skin:    General: Skin is warm and dry.     Findings: No rash.  Neurological:     Mental Status: He is alert and oriented to person, place, and time.     Cranial Nerves: No cranial nerve deficit.     Motor: No abnormal muscle tone.     Coordination: Coordination normal.     Gait: Gait normal.     Deep Tendon Reflexes: Reflexes are normal and symmetric.  Psychiatric:        Behavior: Behavior normal.        Thought Content: Thought  content normal.        Judgment: Judgment normal.     Lab Results  Component Value Date   WBC 5.3 01/27/2023   HGB 15.5 01/27/2023   HCT 46.8 01/27/2023   PLT 159.0 01/27/2023   GLUCOSE 74 01/27/2023   CHOL 194 01/27/2023   TRIG 46.0 01/27/2023   HDL 32.60 (L) 01/27/2023   LDLCALC 152 (H) 01/27/2023   ALT 15 01/27/2023   AST 16 01/27/2023   NA 141 01/27/2023   K 4.3 01/27/2023   CL 106 01/27/2023   CREATININE 1.04 01/27/2023   BUN 12 01/27/2023   CO2 25 01/27/2023   TSH 0.72 01/27/2023   PSA 0.30 01/27/2023   HGBA1C 5.4 07/31/2019   MICROALBUR 0.7 01/21/2014    No results found.  Assessment & Plan:   Problem List Items Addressed This Visit     Hypertension    [name] lost weight.  Blood pressure is low.  Reduce lisinopril to half tablet daily (10 mg) if lightheaded      Relevant Medications   lisinopril (ZESTRIL) 20 MG tablet   Other Relevant Orders   CT CARDIAC SCORING ([facility] LOCATIONS ONLY)   Well adult exam - Primary    We discussed age appropriate health related issues, including available/recomended screening tests and vaccinations. Labs were ordered to be later reviewed . All questions were answered. We discussed one or more of the following - seat belt use, use of sunscreen/sun exposure exercise, safe sex, fall risk reduction, second hand smoke exposure, firearm use and storage, seat belt use, a need for adhering to healthy diet and exercise. Labs were ordered .  All questions were answered. Lost wt on diet and Saxenda Family history of coronary disease.  Coronary calcium CT scan suggested - will schedule      Relevant Orders   TSH (Completed)   Urinalysis (Completed)   CBC with Differential/Platelet (Completed)   Lipid panel (Completed)   PSA (Completed)   Comprehensive metabolic panel (Completed)   Other Visit Diagnoses     Need for vaccination       Relevant Orders   Tdap vaccine greater than or equal to 7yo IM (Completed)         Meds  ordered this encounter  Medications   Liraglutide -Weight Management (SAXENDA) 18 MG/3ML SOPN    Sig: Inject 3 mg into the skin daily.    Dispense:  15 mL    Refill:  5   lisinopril (ZESTRIL) 20 MG tablet    Sig: Take 1/2  tab daily      Follow-up: Return in about 6 months (around 07/28/2023) for a follow-up visit.  Sonda Primes, MD

## 2023-02-09 ENCOUNTER — Ambulatory Visit
Admission: RE | Admit: 2023-02-09 | Discharge: 2023-02-09 | Disposition: A | Discharge status: Home / Self Care | Payer: No Typology Code available for payment source | Source: Ambulatory Visit | Attending: Internal Medicine | Admitting: Internal Medicine

## 2023-02-09 DIAGNOSIS — I10 Essential (primary) hypertension: Secondary | ICD-10-CM

## 2023-02-16 ENCOUNTER — Encounter: Payer: Self-pay | Admitting: Internal Medicine

## 2023-02-16 ENCOUNTER — Other Ambulatory Visit: Payer: Self-pay | Admitting: Internal Medicine

## 2023-02-16 DIAGNOSIS — I2510 Atherosclerotic heart disease of native coronary artery without angina pectoris: Secondary | ICD-10-CM | POA: Insufficient documentation

## 2023-02-16 MED ORDER — ASPIRIN 81 MG PO TBEC: 81.0000 mg | DELAYED_RELEASE_TABLET | Freq: Every day | ORAL | Status: AC

## 2023-02-16 MED ORDER — FISH OIL 1000 MG PO CAPS: 2.0000 | ORAL_CAPSULE | Freq: Every day | ORAL | Status: AC

## 2023-03-05 ENCOUNTER — Other Ambulatory Visit: Payer: Self-pay | Admitting: Internal Medicine

## 2023-04-18 ENCOUNTER — Other Ambulatory Visit: Payer: Self-pay | Admitting: Internal Medicine

## 2023-07-28 ENCOUNTER — Other Ambulatory Visit: Payer: Self-pay | Admitting: Internal Medicine

## 2023-11-17 ENCOUNTER — Other Ambulatory Visit: Payer: Self-pay | Admitting: Internal Medicine

## 2023-12-22 ENCOUNTER — Encounter: Payer: Self-pay | Admitting: Internal Medicine

## 2023-12-22 ENCOUNTER — Other Ambulatory Visit: Payer: Self-pay

## 2023-12-22 MED ORDER — LIRAGLUTIDE -WEIGHT MANAGEMENT 18 MG/3ML ~~LOC~~ SOPN: 3.0000 mg | PEN_INJECTOR | Freq: Every day | SUBCUTANEOUS | 5 refills | Status: DC

## 2023-12-28 ENCOUNTER — Encounter: Payer: Self-pay | Admitting: Internal Medicine

## 2024-01-02 ENCOUNTER — Encounter: Payer: Self-pay | Admitting: Internal Medicine

## 2024-01-04 NOTE — Telephone Encounter (Signed)
 Copied from CRM [id number]. Topic: Clinical - Medication Prior Auth >> Jan 04, 2024  9:56 AM Franky GRADE wrote: Reason for CRM: Patient is having trouble refill his Liraglutide  -Weight Management (SAXENDA ) 18 MG/3ML SOPN [[id number]], per patient the pharmacy is informing him that they need doctor's authorization. Patient has submitted a couple messages through mychart with response.

## 2024-01-09 ENCOUNTER — Encounter: Payer: Self-pay | Admitting: Internal Medicine

## 2024-01-09 ENCOUNTER — Other Ambulatory Visit (HOSPITAL_COMMUNITY): Payer: Self-pay

## 2024-01-09 NOTE — Telephone Encounter (Signed)
Forwarded to prior auth team

## 2024-01-10 ENCOUNTER — Other Ambulatory Visit (HOSPITAL_COMMUNITY): Payer: Self-pay

## 2024-01-11 ENCOUNTER — Telehealth: Payer: Self-pay

## 2024-01-11 NOTE — Telephone Encounter (Signed)
 Pharmacy Patient Advocate Encounter  Received notification from [employer] that Prior Authorization for Liraglutide  18MG /3ML pen-injectors has been DENIED.  Full denial letter will be uploaded to the media tab. See denial reason below.  This request was denied because you did not meet the following requirements: The requested medication and/or diagnosis are not a covered benefit and excluded from coverage in accordance with the terms and conditions of your plan benefit. Therefore, the request has been administratively denied.  PA #/Case ID/Reference #: [id number]

## 2024-01-22 ENCOUNTER — Other Ambulatory Visit: Payer: Self-pay | Admitting: Internal Medicine

## 2024-03-22 ENCOUNTER — Other Ambulatory Visit: Payer: Self-pay | Admitting: Internal Medicine

## 2024-04-03 ENCOUNTER — Encounter: Payer: Self-pay | Admitting: Internal Medicine

## 2024-04-25 ENCOUNTER — Other Ambulatory Visit (HOSPITAL_COMMUNITY): Payer: Self-pay

## 2024-04-29 ENCOUNTER — Other Ambulatory Visit (HOSPITAL_COMMUNITY): Payer: Self-pay

## 2024-04-30 ENCOUNTER — Other Ambulatory Visit (HOSPITAL_COMMUNITY): Payer: Self-pay

## 2024-04-30 ENCOUNTER — Telehealth: Payer: Self-pay | Admitting: Pharmacy Technician

## 2024-04-30 NOTE — Telephone Encounter (Signed)
 Pharmacy Patient Advocate Encounter   Received notification from [facility] that prior authorization for Wegovy  0.25mg  is required/requested.   Insurance verification completed.   The patient is insured through [facility].   Per test claim:  Wt prgm enroll reqd for covrage-Mbr txt. 'Rx' to [id number]. Msg&data rates apply... [email or web address] Then a Prior Authorization is also required.  Archived Key: [id number] - Please put in a new prescription and have pt enrol in the weight management program if you would like him to be on this medication, then let us  know when he's done that and we can work on the PA.   Thanks, [employer].

## 2024-05-01 ENCOUNTER — Encounter: Admitting: Internal Medicine

## 2024-05-03 ENCOUNTER — Ambulatory Visit: Admitting: Internal Medicine

## 2024-05-03 ENCOUNTER — Encounter: Payer: Self-pay | Admitting: Internal Medicine

## 2024-05-03 VITALS — BP 118/68 | HR 78 | Temp 98.0°F | Ht 76.0 in | Wt 254.0 lb

## 2024-05-03 DIAGNOSIS — Z125 Encounter for screening for malignant neoplasm of prostate: Secondary | ICD-10-CM

## 2024-05-03 DIAGNOSIS — E785 Hyperlipidemia, unspecified: Secondary | ICD-10-CM | POA: Diagnosis not present

## 2024-05-03 DIAGNOSIS — E559 Vitamin D deficiency, unspecified: Secondary | ICD-10-CM

## 2024-05-03 DIAGNOSIS — K602 Anal fissure, unspecified: Secondary | ICD-10-CM | POA: Diagnosis not present

## 2024-05-03 DIAGNOSIS — Z0000 Encounter for general adult medical examination without abnormal findings: Secondary | ICD-10-CM

## 2024-05-03 DIAGNOSIS — R7303 Prediabetes: Secondary | ICD-10-CM | POA: Diagnosis not present

## 2024-05-03 DIAGNOSIS — I2583 Coronary atherosclerosis due to lipid rich plaque: Secondary | ICD-10-CM

## 2024-05-03 DIAGNOSIS — I10 Essential (primary) hypertension: Secondary | ICD-10-CM | POA: Diagnosis not present

## 2024-05-03 LAB — CBC WITH DIFFERENTIAL/PLATELET
Basophils Absolute: 0 10*3/uL (ref 0.0–0.1)
Basophils Relative: 0.8 % (ref 0.0–3.0)
Eosinophils Absolute: 0.1 10*3/uL (ref 0.0–0.7)
Eosinophils Relative: 2.5 % (ref 0.0–5.0)
HCT: 47.1 % (ref 39.0–52.0)
Hemoglobin: 16 g/dL (ref 13.0–17.0)
Lymphocytes Relative: 16.1 % (ref 12.0–46.0)
Lymphs Abs: 0.9 10*3/uL (ref 0.7–4.0)
MCHC: 34 g/dL (ref 30.0–36.0)
MCV: 88.3 fl (ref 78.0–100.0)
Monocytes Absolute: 0.4 10*3/uL (ref 0.1–1.0)
Monocytes Relative: 7.2 % (ref 3.0–12.0)
Neutro Abs: 4.2 10*3/uL (ref 1.4–7.7)
Neutrophils Relative %: 73.4 % (ref 43.0–77.0)
Platelets: 134 10*3/uL — ABNORMAL LOW (ref 150.0–400.0)
RBC: 5.34 Mil/uL (ref 4.22–5.81)
RDW: 12.2 % (ref 11.5–15.5)
WBC: 5.7 10*3/uL (ref 4.0–10.5)

## 2024-05-03 LAB — COMPREHENSIVE METABOLIC PANEL WITH GFR
ALT: 15 U/L (ref 3–53)
AST: 17 U/L (ref 5–37)
Albumin: 4.1 g/dL (ref 3.5–5.2)
Alkaline Phosphatase: 113 U/L (ref 39–117)
BUN: 15 mg/dL (ref 6–23)
CO2: 27 meq/L (ref 19–32)
Calcium: 8.9 mg/dL (ref 8.4–10.5)
Chloride: 102 meq/L (ref 96–112)
Creatinine, Ser: 1.09 mg/dL (ref 0.40–1.50)
GFR: 82.57 mL/min
Glucose, Bld: 77 mg/dL (ref 70–99)
Potassium: 4.4 meq/L (ref 3.5–5.1)
Sodium: 137 meq/L (ref 135–145)
Total Bilirubin: 0.7 mg/dL (ref 0.2–1.2)
Total Protein: 6.9 g/dL (ref 6.0–8.3)

## 2024-05-03 LAB — URINALYSIS
Bilirubin Urine: NEGATIVE
Hgb urine dipstick: NEGATIVE
Ketones, ur: NEGATIVE
Leukocytes,Ua: NEGATIVE
Nitrite: NEGATIVE
Specific Gravity, Urine: 1.025 (ref 1.000–1.030)
Total Protein, Urine: NEGATIVE
Urine Glucose: NEGATIVE
Urobilinogen, UA: 1 (ref 0.0–1.0)
pH: 6 (ref 5.0–8.0)

## 2024-05-03 LAB — LIPID PANEL
Cholesterol: 174 mg/dL (ref 28–200)
HDL: 34.3 mg/dL — ABNORMAL LOW
LDL Cholesterol: 132 mg/dL — ABNORMAL HIGH (ref 10–99)
NonHDL: 139.56
Total CHOL/HDL Ratio: 5
Triglycerides: 40 mg/dL (ref 10.0–149.0)
VLDL: 8 mg/dL (ref 0.0–40.0)

## 2024-05-03 LAB — PSA: PSA: 0.34 ng/mL (ref 0.10–4.00)

## 2024-05-03 LAB — TSH: TSH: 0.75 u[IU]/mL (ref 0.35–5.50)

## 2024-05-03 LAB — HEMOGLOBIN A1C: Hgb A1c MFr Bld: 5.4 % (ref 4.6–6.5)

## 2024-05-03 MED ORDER — LIRAGLUTIDE -WEIGHT MANAGEMENT 18 MG/3ML ~~LOC~~ SOPN: 3.0000 mg | PEN_INJECTOR | Freq: Every day | SUBCUTANEOUS | 5 refills | Status: AC

## 2024-05-03 MED ORDER — PRAVASTATIN SODIUM 20 MG PO TABS: 20.0000 mg | ORAL_TABLET | Freq: Every day | ORAL | 11 refills | Status: AC

## 2024-05-03 NOTE — Assessment & Plan Note (Signed)
 Check A1c.

## 2024-05-03 NOTE — Assessment & Plan Note (Signed)
 Vit D

## 2024-05-03 NOTE — Assessment & Plan Note (Signed)
 Lost 80 lbs Statin intolerant. - try Pravachol 

## 2024-05-03 NOTE — Assessment & Plan Note (Addendum)
 02/2023 coronary calcium CT score is 106 Re-start baby aspirin  and fish oil  Statin intolerant. Try Pravachol 

## 2024-05-03 NOTE — Progress Notes (Signed)
 "  Subjective:  Patient ID: Frank Shaffer, male    DOB: Dec 11, 1980  Age: 44 y.o. MRN: [id number]  CC: No chief complaint on file.   HPI Frank Shaffer presents for a well exam  Outpatient Medications Prior to Visit  Medication Sig Dispense Refill   Cholecalciferol (VITAMIN D3) 50 MCG (2000 UT) TABS Take by mouth.     citalopram  (CELEXA ) 20 MG tablet TAKE 1 TABLET (20 MG TOTAL) BY MOUTH DAILY. MUST KEEP APPT FOR FUTURE REFILLS 90 tablet 3   Insulin  Pen Needle (TECHLITE PLUS PEN NEEDLES) 32G X 4 MM MISC qd 100 each 1   lisinopril  (ZESTRIL ) 20 MG tablet TAKE 1 TABLET (20 MG TOTAL) BY MOUTH DAILY. MUST KEEP APPT FOR FUTURE REFILLS 90 tablet 3   Omega-3 Fatty Acids (FISH OIL ) 1000 MG CAPS Take 2 capsules (2,000 mg total) by mouth daily.     propranolol  (INDERAL ) 20 MG tablet TAKE 1 TABLET (20 MG TOTAL) BY MOUTH 2 (TWO) TIMES DAILY. MUST KEEP APPT FOR FUTURE REFILLS 60 tablet 11   Liraglutide  -Weight Management (SAXENDA ) 18 MG/3ML SOPN Inject 3 mg into the skin daily. 3 mL 5   No facility-administered medications prior to visit.    ROS: Review of Systems  Constitutional:  Negative for appetite change, fatigue and unexpected weight change.  HENT:  Negative for congestion, nosebleeds, sneezing, sore throat and trouble swallowing.   Eyes:  Negative for itching and visual disturbance.  Respiratory:  Negative for cough.   Cardiovascular:  Negative for chest pain, palpitations and leg swelling.  Gastrointestinal:  Negative for abdominal distention, blood in stool, diarrhea and nausea.  Genitourinary:  Negative for frequency and hematuria.  Musculoskeletal:  Negative for back pain, gait problem, joint swelling and neck pain.  Skin:  Negative for rash.  Neurological:  Negative for dizziness, tremors, speech difficulty and weakness.  Psychiatric/Behavioral:  Negative for agitation, dysphoric mood, sleep disturbance and suicidal ideas. The patient is not nervous/anxious.     Objective:   BP 118/68   Pulse 78   Temp 98 F (36.7 C) (Oral)   Ht 6' 4 (1.93 m)   Wt 254 lb (115.2 kg)   SpO2 99%   BMI 30.92 kg/m   BP Readings from Last 3 Encounters:  05/03/24 118/68  01/27/23 110/72  05/11/22 130/78    Wt Readings from Last 3 Encounters:  05/03/24 254 lb (115.2 kg)  01/27/23 264 lb (119.7 kg)  05/11/22 (!) 303 lb (137.4 kg)    Physical Exam Constitutional:      General: He is not in acute distress.    Appearance: He is well-developed. He is obese.     Comments: NAD  Eyes:     Conjunctiva/sclera: Conjunctivae normal.     Pupils: Pupils are equal, round, and reactive to light.  Neck:     Thyroid : No thyromegaly.     Vascular: No JVD.  Cardiovascular:     Rate and Rhythm: Normal rate and regular rhythm.     Heart sounds: Normal heart sounds. No murmur heard.    No friction rub. No gallop.  Pulmonary:     Effort: Pulmonary effort is normal. No respiratory distress.     Breath sounds: Normal breath sounds. No wheezing or rales.  Chest:     Chest wall: No tenderness.  Abdominal:     General: Bowel sounds are normal. There is no distension.     Palpations: Abdomen is soft. There is no mass.  Tenderness: There is no abdominal tenderness. There is no guarding or rebound.  Musculoskeletal:        General: No tenderness. Normal range of motion.     Cervical back: Normal range of motion.     Right lower leg: No edema.     Left lower leg: No edema.  Lymphadenopathy:     Cervical: No cervical adenopathy.  Skin:    General: Skin is warm and dry.     Findings: No rash.  Neurological:     Mental Status: He is alert and oriented to person, place, and time.     Cranial Nerves: No cranial nerve deficit.     Motor: No abnormal muscle tone.     Coordination: Coordination normal.     Gait: Gait normal.     Deep Tendon Reflexes: Reflexes are normal and symmetric.  Psychiatric:        Behavior: Behavior normal.        Thought Content: Thought content normal.         Judgment: Judgment normal.     Lab Results  Component Value Date   WBC 5.3 01/27/2023   HGB 15.5 01/27/2023   HCT 46.8 01/27/2023   PLT 159.0 01/27/2023   GLUCOSE 74 01/27/2023   CHOL 194 01/27/2023   TRIG 46.0 01/27/2023   HDL 32.60 (L) 01/27/2023   LDLCALC 152 (H) 01/27/2023   ALT 15 01/27/2023   AST 16 01/27/2023   NA 141 01/27/2023   K 4.3 01/27/2023   CL 106 01/27/2023   CREATININE 1.04 01/27/2023   BUN 12 01/27/2023   CO2 25 01/27/2023   TSH 0.72 01/27/2023   PSA 0.30 01/27/2023   HGBA1C 5.4 07/31/2019   MICROALBUR 0.7 01/21/2014    CT CARDIAC SCORING ([facility] LOCATIONS ONLY) Result Date: 02/13/2023 CLINICAL DATA:  Family history * Tracking Code: FCC * EXAM: CT CARDIAC CORONARY ARTERY CALCIUM SCORE TECHNIQUE: Non-contrast imaging through the heart was performed using prospective ECG gating. Image post processing was performed on an independent workstation, allowing for quantitative analysis of the heart and coronary arteries. Note that this exam targets the heart and the chest was not imaged in its entirety. COMPARISON:  None available. FINDINGS: CORONARY CALCIUM SCORES: Left Main: 0 LAD: 34 LCx: 0 RCA/PDA: 72.4 Total Agatston Score: 106 MESA database percentile: 97. Note, this is compared to 61 year olds, the youngest in the [facility] database. AORTA MEASUREMENTS: Ascending Aorta: 3.1 cm Descending Aorta:2.0 cm OTHER FINDINGS: Heart is normal size. Aorta normal caliber. No adenopathy. No confluent airspace opacities or effusions. No acute findings in the upper abdomen. Chest wall soft tissues are unremarkable. No acute bony abnormality. IMPRESSION: Total Agatston score: 106 [place] database percentile: 97 when compared to 76 year olds, the youngest in the [place]. No acute or significant extracardiac abnormality. Electronically Signed   By: Franky Crease M.D.   On: 02/13/2023 11:47    Assessment & Plan:   Problem List Items Addressed This Visit     Anal fissure - Primary    Resolved      Vitamin D  deficiency   Vit D      Hypertension   BP Readings from Last 3 Encounters:  05/03/24 118/68  01/27/23 110/72  05/11/22 130/78         Relevant Medications   pravastatin  (PRAVACHOL ) 20 MG tablet   Pre-diabetes   Check A1c      Relevant Orders   Hemoglobin A1c   Well adult exam  We discussed age appropriate health related issues, including available/recomended screening tests and vaccinations. Labs were ordered to be later reviewed . All questions were answered. We discussed one or more of the following - seat belt use, use of sunscreen/sun exposure exercise, safe sex, fall risk reduction, second hand smoke exposure, firearm use and storage, seat belt use, a need for adhering to healthy diet and exercise. Labs were ordered .  All questions were answered. Lost wt on diet and Saxenda  Family history of coronary disease.  Coronary calcium CT scan suggested - will schedule 02/2023 coronary calcium CT score is 106 Re-start baby aspirin  and fish oil  Statin intolerant. Try Pravachol  Colon 2023 - anal fissure      Relevant Medications   pravastatin  (PRAVACHOL ) 20 MG tablet   Other Relevant Orders   TSH   Urinalysis   CBC with Differential/Platelet   Lipid panel   PSA   Comprehensive metabolic panel with GFR   Hemoglobin A1c   Dyslipidemia   Lost 80 lbs Statin intolerant. - try Pravachol       Relevant Medications   pravastatin  (PRAVACHOL ) 20 MG tablet   Coronary atherosclerosis   02/2023 coronary calcium CT score is 106 Re-start baby aspirin  and fish oil  Statin intolerant. Try Pravachol       Relevant Medications   pravastatin  (PRAVACHOL ) 20 MG tablet      Meds ordered this encounter  Medications   pravastatin  (PRAVACHOL ) 20 MG tablet    Sig: Take 1 tablet (20 mg total) by mouth daily.    Dispense:  30 tablet    Refill:  11   Liraglutide  -Weight Management (SAXENDA ) 18 MG/3ML SOPN    Sig: Inject 3 mg into the skin daily.    Dispense:   3 mL    Refill:  5      Follow-up: Return in about 6 months (around 10/31/2024) for a follow-up visit.  Marolyn Noel, MD "

## 2024-05-03 NOTE — Assessment & Plan Note (Signed)
 Resolved

## 2024-05-03 NOTE — Assessment & Plan Note (Signed)
 BP Readings from Last 3 Encounters:  05/03/24 118/68  01/27/23 110/72  05/11/22 130/78

## 2024-05-03 NOTE — Assessment & Plan Note (Addendum)
 We discussed age appropriate health related issues, including available/recomended screening tests and vaccinations. Labs were ordered to be later reviewed . All questions were answered. We discussed one or more of the following - seat belt use, use of sunscreen/sun exposure exercise, safe sex, fall risk reduction, second hand smoke exposure, firearm use and storage, seat belt use, a need for adhering to healthy diet and exercise. Labs were ordered .  All questions were answered. Lost wt on diet and Saxenda  Family history of coronary disease.  Coronary calcium CT scan suggested - will schedule 02/2023 coronary calcium CT score is 106 Re-start baby aspirin  and fish oil  Statin intolerant. Try Pravachol  Colon 2023 - anal fissure

## 2024-05-06 ENCOUNTER — Ambulatory Visit: Payer: Self-pay | Admitting: Internal Medicine

## 2024-05-10 ENCOUNTER — Other Ambulatory Visit (HOSPITAL_COMMUNITY): Payer: Self-pay

## 2024-05-10 ENCOUNTER — Encounter: Payer: Self-pay | Admitting: Internal Medicine
